# Patient Record
Sex: Female | Born: 1975 | Race: White | Hispanic: No | Marital: Single
Health system: Midwestern US, Community
[De-identification: ages and names within clinical notes are randomized; demographics above are authoritative.]

## PROBLEM LIST (undated history)

## (undated) DIAGNOSIS — F319 Bipolar disorder, unspecified: Secondary | ICD-10-CM

## (undated) DIAGNOSIS — M1990 Unspecified osteoarthritis, unspecified site: Secondary | ICD-10-CM

## (undated) DIAGNOSIS — F410 Panic disorder [episodic paroxysmal anxiety] without agoraphobia: Secondary | ICD-10-CM

## (undated) HISTORY — PX: OTHER SURGICAL HISTORY: SHX169

---

## 2013-11-08 ENCOUNTER — Encounter (HOSPITAL_COMMUNITY): Payer: Self-pay | Admitting: Emergency Medicine

## 2013-11-08 ENCOUNTER — Emergency Department (HOSPITAL_COMMUNITY)
Admission: EM | Admit: 2013-11-08 | Discharge: 2013-11-09 | Disposition: A | Discharge status: Home / Self Care | Payer: Medicaid Other | Attending: Emergency Medicine | Admitting: Emergency Medicine

## 2013-11-08 ENCOUNTER — Emergency Department (HOSPITAL_COMMUNITY): Payer: Medicaid Other

## 2013-11-08 DIAGNOSIS — Z3202 Encounter for pregnancy test, result negative: Secondary | ICD-10-CM | POA: Insufficient documentation

## 2013-11-08 DIAGNOSIS — N898 Other specified noninflammatory disorders of vagina: Secondary | ICD-10-CM | POA: Insufficient documentation

## 2013-11-08 DIAGNOSIS — Z8739 Personal history of other diseases of the musculoskeletal system and connective tissue: Secondary | ICD-10-CM | POA: Insufficient documentation

## 2013-11-08 DIAGNOSIS — B9789 Other viral agents as the cause of diseases classified elsewhere: Secondary | ICD-10-CM | POA: Insufficient documentation

## 2013-11-08 DIAGNOSIS — Z8659 Personal history of other mental and behavioral disorders: Secondary | ICD-10-CM | POA: Insufficient documentation

## 2013-11-08 DIAGNOSIS — Z975 Presence of (intrauterine) contraceptive device: Secondary | ICD-10-CM | POA: Insufficient documentation

## 2013-11-08 DIAGNOSIS — B349 Viral infection, unspecified: Secondary | ICD-10-CM

## 2013-11-08 DIAGNOSIS — N949 Unspecified condition associated with female genital organs and menstrual cycle: Secondary | ICD-10-CM | POA: Insufficient documentation

## 2013-11-08 DIAGNOSIS — R102 Pelvic and perineal pain: Secondary | ICD-10-CM

## 2013-11-08 HISTORY — DX: Bipolar disorder, unspecified: F31.9

## 2013-11-08 HISTORY — DX: Unspecified osteoarthritis, unspecified site: M19.90

## 2013-11-08 LAB — CBC WITH DIFFERENTIAL/PLATELET
BASOS ABS: 0.1 10*3/uL (ref 0.0–0.1)
Basophils Relative: 1 % (ref 0–1)
Eosinophils Absolute: 0.1 10*3/uL (ref 0.0–0.7)
Eosinophils Relative: 1 % (ref 0–5)
HEMATOCRIT: 39.6 % (ref 36.0–46.0)
Hemoglobin: 13.3 g/dL (ref 12.0–15.0)
LYMPHS ABS: 1.7 10*3/uL (ref 0.7–4.0)
LYMPHS PCT: 19 % (ref 12–46)
MCH: 29.8 pg (ref 26.0–34.0)
MCHC: 33.6 g/dL (ref 30.0–36.0)
MCV: 88.6 fL (ref 78.0–100.0)
MONO ABS: 0.6 10*3/uL (ref 0.1–1.0)
Monocytes Relative: 7 % (ref 3–12)
NEUTROS ABS: 6.2 10*3/uL (ref 1.7–7.7)
Neutrophils Relative %: 72 % (ref 43–77)
Platelets: 239 10*3/uL (ref 150–400)
RBC: 4.47 MIL/uL (ref 3.87–5.11)
RDW: 13.2 % (ref 11.5–15.5)
WBC: 8.7 10*3/uL (ref 4.0–10.5)

## 2013-11-08 LAB — I-STAT CHEM 8, ED
BUN: 13 mg/dL (ref 6–23)
CALCIUM ION: 1.18 mmol/L (ref 1.12–1.23)
CHLORIDE: 103 meq/L (ref 96–112)
Creatinine, Ser: 1 mg/dL (ref 0.50–1.10)
Glucose, Bld: 67 mg/dL — ABNORMAL LOW (ref 70–99)
HCT: 42 % (ref 36.0–46.0)
Hemoglobin: 14.3 g/dL (ref 12.0–15.0)
Potassium: 4.1 mEq/L (ref 3.7–5.3)
Sodium: 141 mEq/L (ref 137–147)
TCO2: 24 mmol/L (ref 0–100)

## 2013-11-08 LAB — URINALYSIS, ROUTINE W REFLEX MICROSCOPIC
Bilirubin Urine: NEGATIVE
GLUCOSE, UA: NEGATIVE mg/dL
Hgb urine dipstick: NEGATIVE
Ketones, ur: NEGATIVE mg/dL
LEUKOCYTES UA: NEGATIVE
NITRITE: NEGATIVE
PROTEIN: NEGATIVE mg/dL
Specific Gravity, Urine: 1.01 (ref 1.005–1.030)
UROBILINOGEN UA: 0.2 mg/dL (ref 0.0–1.0)
pH: 7 (ref 5.0–8.0)

## 2013-11-08 LAB — WET PREP, GENITAL
Trich, Wet Prep: NONE SEEN
Yeast Wet Prep HPF POC: NONE SEEN

## 2013-11-08 LAB — POC URINE PREG, ED: Preg Test, Ur: NEGATIVE

## 2013-11-08 MED ORDER — CEFTRIAXONE SODIUM 250 MG IJ SOLR
250.0000 mg | Freq: Once | INTRAMUSCULAR | Status: AC
  Administered 2013-11-08: 250 mg via INTRAMUSCULAR
  Filled 2013-11-08: qty 250

## 2013-11-08 MED ORDER — HYDROMORPHONE HCL PF 1 MG/ML IJ SOLN
1.0000 mg | Freq: Once | INTRAMUSCULAR | Status: AC
  Administered 2013-11-08: 1 mg via INTRAVENOUS
  Filled 2013-11-08: qty 1

## 2013-11-08 MED ORDER — KETOROLAC TROMETHAMINE 30 MG/ML IJ SOLN
30.0000 mg | Freq: Once | INTRAMUSCULAR | Status: AC
  Administered 2013-11-08: 30 mg via INTRAVENOUS
  Filled 2013-11-08: qty 1

## 2013-11-08 MED ORDER — GUAIFENESIN 100 MG/5ML PO LIQD: 100.0000 mg | ORAL | Status: DC | PRN

## 2013-11-08 MED ORDER — AZITHROMYCIN 250 MG PO TABS
1000.0000 mg | ORAL_TABLET | Freq: Once | ORAL | Status: AC
  Administered 2013-11-08: 1000 mg via ORAL
  Filled 2013-11-08: qty 4

## 2013-11-08 MED ORDER — HYDROCODONE-ACETAMINOPHEN 7.5-325 MG/15ML PO SOLN: 15.0000 mL | Freq: Four times a day (QID) | ORAL | Status: DC | PRN

## 2013-11-08 MED ORDER — HYDROCODONE-ACETAMINOPHEN 7.5-325 MG/15ML PO SOLN
10.0000 mL | Freq: Once | ORAL | Status: AC
Start: 2013-11-08 — End: 2013-11-08
  Administered 2013-11-08: 10 mL via ORAL
  Filled 2013-11-08: qty 15

## 2013-11-08 MED ORDER — SODIUM CHLORIDE 0.9 % IV BOLUS (SEPSIS)
1000.0000 mL | Freq: Once | INTRAVENOUS | Status: AC
  Administered 2013-11-08: 1000 mL via INTRAVENOUS

## 2013-11-08 NOTE — ED Notes (Signed)
MD at bedside. 

## 2013-11-08 NOTE — Discharge Instructions (Signed)
Viral Infections A viral infection can be caused by different types of viruses.Most viral infections are not serious and resolve on their own. However, some infections may cause severe symptoms and may lead to further complications. SYMPTOMS Viruses can frequently cause:  Minor sore throat.  Aches and pains.  Headaches.  Runny nose.  Different types of rashes.  Watery eyes.  Tiredness.  Cough.  Loss of appetite.  Gastrointestinal infections, resulting in nausea, vomiting, and diarrhea. These symptoms do not respond to antibiotics because the infection is not caused by bacteria. However, you might catch a bacterial infection following the viral infection. This is sometimes called a "superinfection." Symptoms of such a bacterial infection may include:  Worsening sore throat with pus and difficulty swallowing.  Swollen neck glands.  Chills and a high or persistent fever.  Severe headache.  Tenderness over the sinuses.  Persistent overall ill feeling (malaise), muscle aches, and tiredness (fatigue).  Persistent cough.  Yellow, green, or brown mucus production with coughing. HOME CARE INSTRUCTIONS   Only take over-the-counter or prescription medicines for pain, discomfort, diarrhea, or fever as directed by your caregiver.  Drink enough water and fluids to keep your urine clear or pale yellow. Sports drinks can provide valuable electrolytes, sugars, and hydration.  Get plenty of rest and maintain proper nutrition. Soups and broths with crackers or rice are fine. SEEK IMMEDIATE MEDICAL CARE IF:   You have severe headaches, shortness of breath, chest pain, neck pain, or an unusual rash.  You have uncontrolled vomiting, diarrhea, or you are unable to keep down fluids.  You or your child has an oral temperature above 102 F (38.9 C), not controlled by medicine.  Your baby is older than 3 months with a rectal temperature of 102 F (38.9 C) or higher.  Your baby is 833  months old or younger with a rectal temperature of 100.4 F (38 C) or higher. MAKE SURE YOU:   Understand these instructions.  Will watch your condition.  Will get help right away if you are not doing well or get worse. Document Released: 03/08/2005 Document Revised: 08/21/2011 Document Reviewed: 10/03/2010 [facility] Patient Information 2014 [place], [place].   Emergency Department Resource Guide 1) Find a Doctor and Pay Out of Pocket Although you won't have to find out who is covered by your insurance plan, it is a good idea to ask around and get recommendations. You will then need to call the office and see if the doctor you have chosen will accept you as a new patient and what types of options they offer for patients who are self-pay. Some doctors offer discounts or will set up payment plans for their patients who do not have insurance, but you will need to ask so you aren't surprised when you get to your appointment.  2) Contact Your Local Health Department Not all health departments have doctors that can see patients for sick visits, but many do, so it is worth a call to see if yours does. If you don't know where your local health department is, you can check in your phone book. The CDC also has a tool to help you locate your state's health department, and many state websites also have listings of all of their local health departments.  3) Find a Walk-in Clinic If your illness is not likely to be very severe or complicated, you may want to try a walk in clinic. These are popping up all over the country in pharmacies, drugstores, and shopping centers.  They're usually staffed by nurse practitioners or physician assistants that have been trained to treat common illnesses and complaints. They're usually fairly quick and inexpensive. However, if you have serious medical issues or chronic medical problems, these are probably not your best option.  No Primary Care Doctor: - Call Health Connect at   [phone] - they can help you locate a primary care doctor that  accepts your insurance, provides certain services, etc. - Physician Referral Service- [phone]  Chronic Pain Problems: Organization         Address  Phone   Notes  [facility]  [phone] Patients need to be referred by their primary care doctor.   Medication Assistance: Organization         Address  Phone   Notes  [facility] Medication [facility] [address] [place], [place] [address] [phone] --Must be a resident of [facility] -- Must have NO insurance coverage whatsoever (no Medicaid/ Medicare, etc.) -- The pt. MUST have a primary care doctor that directs their care regularly and follows them in the community   MedAssist  [phone]   [employer]  [phone]    Agencies that provide inexpensive medical care: Organization         Address  Phone   Notes  [facility]  [phone]   [facility]    [phone]   [facility] [address] [place], [place] [address] [phone]   [facility] [address] [place] [place] [phone]   Planned Parenthood    [phone]   [facility]    [phone]   Community Health and [facility]  [address], [place] Phone:  [phone], Fax:  [phone] Hours of Operation:  9 am - 6 pm, M-F.  Also accepts Medicaid/Medicare and self-pay.  [facility]  [address], [place] Phone: [phone], Fax: [phone]. Hours of Operation:  8:30 am - 5:30 pm, M-F.  Also accepts Medicaid and self-pay.  [facility] [place] [address] [place] Phone: [phone]   [facility] [address] Natasha Bence [place], [place] [phone], Ext. [phone] Mondays & Thursdays: 7-9 AM.  First 15 patients are seen on a first come, first serve basis.     Medicaid-accepting [facility] Providers:  Organization         Address  Phone   Notes  [facility] [address], [place] [phone] Also accepts self-pay patients.  [facility] [address] Laurell Josephs [place], [place]  [phone]   [facility] [address], [place] [phone]   [facility] [address] [place] [phone]   Renaye Rakers [address], [place]   [phone] Only accepts [place] patients after they have their name applied to their card.   Self-Pay (no insurance) in [facility]:  Organization         Address  Phone   Notes  Sickle Cell Patients, [facility] [address] [employer], [place] [phone]   [facility] [address] [place], [place] [phone]   [facility]  [address], [place] [phone]  [facility]/Dr. Osei-Bonsu  [address] [place] [address], [place] or [address], [place] [phone] Phone number for both [place] and [place] locations is the same.  [facility] and [facility] [address], [place] [phone]   [facility] [place] [address] [place] [address], [place] or [address] [phone]   [facility] [address], [place] [phone]) [phone], phone; [phone], fax Sees patients 1st and 3rd Saturday of every month.  Must not qualify for public or private insurance (i.e. Medicaid, Medicare, [place] Health Choice, Veterans' Benefits)  Household income should be no more than 200% of the poverty level The clinic cannot treat you if you are pregnant or think you are pregnant  Sexually transmitted diseases are not treated at the clinic.    Dental Care: Organization         Address  Phone  Notes  [facility]  Department of [facility] [address], [place] [phone] Accepts children up to age 821 who are enrolled in [place]icaid or [place] Health Choice; pregnant women with a Medicaid card; and children who have applied for Medicaid or [place] Health Choice, but were declined, whose parents can pay a reduced fee at time of service.  [facility] Department of [facility] [place]  [address], [place] [phone] Accepts children up to age 37 who are enrolled in [place] or [place] Health Choice; pregnant women with a Medicaid card; and children who have applied for Medicaid or [place] Health Choice, but were declined, whose parents can pay a reduced fee at time of service.  [facility]  [address], [place] [phone]) [phone] Patients are seen by appointment only. Walk-ins are not accepted. [facility] will see patients 718 years of age and older. Monday - Tuesday (8am-5pm) Most Wednesdays (8:30-5pm) $30 per visit, cash only  [facility]  [address], [facility] [phone]) [phone] Patients are seen by appointment only. Walk-ins are not accepted. [facility] will see patients 37 years of age and older. One Wednesday Evening (Monthly: Volunteer Based).  $30 per visit, cash only  [employer] [facility] [facility]  [phone] for adults; Children under age 134, call Graduate Pediatric Dentistry at [phone]. Children aged 734-14, please call [phone] to request a pediatric application.  Dental services are provided in all areas of dental care including fillings, crowns and bridges, complete and partial dentures, implants, gum treatment, root canals, and extractions. Preventive care is also provided. Treatment is provided to both adults and children. Patients are selected via a lottery and there is often a waiting list.   [facility] [address], [place]  [phone]  [email or web address]   [facility] [address], [place], [place] [phone], Ext. [phone] Second and Fourth Thursday of each month, opens at 6:30 AM; Clinic ends at 9 AM.  Patients are seen on a first-come first-served basis, and a limited number are seen during each clinic.   [facility]  [address], [place], [place] [phone]) [phone]   Eligibility Requirements You must have lived in [place], [place], or [place] counties for at least the last three months.   You cannot be eligible for state or federal sponsored [place] Cityhealthcare insurance, including CIGNAVeterans Administration, [place]icaid, or Har[employer] EntertainmentMedicare.   You generally cannot be eligible for healthcare insurance  through your employer.    How to apply: Eligibility screenings are held every Tuesday and Wednesday afternoon from 1:00 pm until 4:00 pm. You do not need an appointment for the interview!  [facility] [address], [place], [place] [phone]   [facility]  [phone]   [facility] [facility]  [phone]   [facility]  [phone]    Behavioral Health Resources in the Community: Intensive Outpatient Programs Organization         Address  Phone  Notes  [facility] [address], [place], [place] [phone]   [facility] [address], [place], [place] [phone]   [facility]: [facility] [address], [place], [place]  [phone]   [facility] [address],  [place], [place] [phone] or [phone]   Substance Abuse Resources Organization         Address  Phone  Notes  [facility]  [phone]   [facility]  [phone]   [facility]  [phone]   [facility]  [phone]   Residential & Outpatient Substance Abuse Program  [phone]   Psychological Services Organization          Address  Phone  Notes  [facility]  [phone]- [phone]   [facility]  [phone]- [phone]   [facility] [address], [place] [phone] or [phone]    Mobile Crisis Teams Organization         Address  Phone  Notes  [facility], Mobile Crisis Care Unit  [phone]   [facility]  [address] [place], [place] [phone]   Doristine LocksSharon DeEsch [address] [place] [phone]    Self-Help/Support Groups Organization         Address  Phone             Notes  Mental Health Assoc. of  - variety of support groups  [phone]- [phone] Call for more information  Narcotics Anonymous (NA), [facility] [address], [place]  2 meetings at this location   Statisticianesidential Treatment Programs Organization         Address  Phone  Notes  [facility] [address],    [place]  [phone]   [facility]  [address], [place] [phone], [place], [place] [phone]   [facility] [address], [place] [phone] Admissions: 8am-3pm M-F  [facility] [address]    [place], [place] [phone]   [facility] [address], [place], [place] [phone]   [facility] [address]  [place], [place] [phone]   [facility] - Intensive Outpatient [address]ure[address] Josephs[address], [place], [place] [phone]   [facility] ([facility] [address]  [place], [place] [phone] or [phone]   [facility] ([facility]) [address] [place], [place] [phone] Accepts Medicaid  [facility] [address]  [place] [phone] Substance Abuse/Addiction Treatment   [facility] Behavioral Health Resources Organization         Address  Phone  Notes  [facility]  [phone]   Angie FavaJulie Brannon, PhD [address] [place], [place]   [phone] or [phone]   [facility]   [address] [place], [place] [phone]  [facility] [address], [place], [place] [phone] Insurance/Medicaid/sponsorship through [facility]  °[facility] [address]                                    [place], [place] [phone] Therapy/tele-psych/case  °[facility] [address]  ° [place], [place] [phone]    °Dr. Arfeen  [phone]   °[facility] [place]  [employer] [place] [facility] 1) [address], [place] °2) [address], [place] °3)  [address], [place] [phone]) [phone] °([phone]) [phone] ° °([phone]) [phone]   °[place] Child Abuse Hotline (336) 342-1394 or (336) [phone] (After Hours)    ° ° ° °

## 2013-11-08 NOTE — ED Notes (Signed)
Pt states she just finished amoxicillin for URI. Pt states she is still having a productive cough, chills, muscle aches and lower abdominal pain. Pt also states her gums are sore. States "I'm shutting down." States today she started having lower abdominal pain. Denies dysuria. Pt ambulatory to exam room with steady gait. Pt alert, no acute distress.

## 2013-11-08 NOTE — ED Provider Notes (Signed)
CSN: [id number]     Arrival date & time 11/08/13  1921 History   First MD Initiated Contact with Patient 11/08/13 1939     Chief Complaint  Patient presents with  . Cough  . Abdominal Pain     (Consider location/radiation/quality/duration/timing/severity/associated sxs/prior Treatment) HPI  38 year old female with history of bipolar presents with URI symptoms, and abdominal pain. Patient states for more than a week she has been having nasal congestions, chest congestions, productive cough, that was initially improved with taking amoxicillin which she finished 3 days ago. However for the past few day she is now having worsening cough, having chills, myalgias, and this afternoon she had severe low abnormal pain and pain with urinating prior to arrival. She reports she had severe UTI in the past requiring hospital admission and therefore decided to come to ER for further management. She denies fever, headache, neck stiffness, SOB, hemoptysis, hematuria, vaginal discharge, or rash. She is an IUD but reports having vaginal bleeding about a week ago, one isolated incident. She denies any pain with sexual activity.  Past Medical History  Diagnosis Date  . Bipolar 1 disorder   . Arthritis    No past surgical history on file. No family history on file. History  Substance Use Topics  . Smoking status: Not on file  . Smokeless tobacco: Not on file  . Alcohol Use: Not on file   OB History   Grav Para Term Preterm Abortions TAB SAB Ect Mult Living                 Review of Systems  All other systems reviewed and are negative.     Allergies  Review of patient's allergies indicates no known allergies.  Home Medications   Prior to Admission medications   Not on File   BP 112/66  Pulse 120  Temp(Src) 99 F (37.2 C) (Oral)  Resp 20  SpO2 99% Physical Exam  Nursing note and vitals reviewed. Constitutional: She appears well-developed and well-nourished. No distress.  HENT:  Head:  Normocephalic and atraumatic.  Right Ear: External ear normal.  Left Ear: External ear normal.  Mouth/Throat: Oropharynx is clear and moist.  Eyes: Conjunctivae are normal.  Neck: Normal range of motion. Neck supple. No JVD present.  Cardiovascular: Normal rate and regular rhythm.   Pulmonary/Chest: Effort normal and breath sounds normal. She exhibits no tenderness.  Persistent nonproductive cough  Abdominal: Soft. There is no tenderness (suprapubic and lower abd tenderness on palpation without guarding or rebound tenderness.  No Murphy sign, no pain at McBurney's point).  Genitourinary: Vagina normal and uterus normal. There is no rash or lesion on the right labia. There is no rash or lesion on the left labia. Cervix exhibits motion tenderness and friability. Cervix exhibits no discharge. Right adnexum displays no mass and no tenderness. Left adnexum displays no mass and no tenderness. No erythema, tenderness or bleeding around the vagina. No vaginal discharge found.    Chaperone present:  Lymphadenopathy:    She has no cervical adenopathy.       Right: No inguinal adenopathy present.       Left: No inguinal adenopathy present.    ED Course  Procedures (including critical care time)  7:58 PM Pt here with URI sxs.  She report sxs persist despite recent abx.  CXR ordered.  Pt also having dysuria and lower abd pain.  Will perform pelvic exam.  Pt is tachycardic, IVF given.    9:19 PM Pt did  have tenderness to cervix on pelvic examination.  Her wet prep shows many WBC.  Pregnancy test negative, UA unremarkable, and CXR normal.  I discussed possibility of PID. Pt sts she has not had sexual activities in 2 years.  I will give rocephin/zithromax here.  Her sxs likely viral etiology, will treat symptomatically.    10:21 PM Pt continues to endorse suprapubic abd pain despite receiving toradol and hycet.  Given the sudden onset of her lower abd pain, i will obtain basic labs and pelvic US to r/o  ovarian torsion.  I have low suspicion for appy given no n/v/d or loss of appetite.  Pain medication given.  Care discussed with Dr. Criss Alvine.   11:15 PM [place] tech notify that pt appears to be too  Uncomfortable to have US performed.  Her labs are reassuring otherwise.  Will give additional pain medication to help facilitate her study.    12:14 AM Pelvic US showing no evidence of ovarian torsion.  Probable physiologic cyst left ovary measuring up to 1.7cm with a small volume pelvic free fluid.  Finding is unconcerning.  Pt is stable for discharge.  She will f/u with her PCP.  Return precaution discussed.     Labs Review Labs Reviewed  WET PREP, GENITAL - Abnormal; Notable for the following:    Clue Cells Wet Prep HPF POC RARE (*)    WBC, Wet Prep HPF POC MANY (*)    All other components within normal limits  I-STAT CHEM 8, ED - Abnormal; Notable for the following:    Glucose, Bld 67 (*)    All other components within normal limits  GC/CHLAMYDIA PROBE AMP  URINALYSIS, ROUTINE W REFLEX MICROSCOPIC  CBC WITH DIFFERENTIAL  POC URINE PREG, ED    Imaging Review Dg Chest 2 View  11/08/2013   CLINICAL DATA:  Cough  EXAM: CHEST  2 VIEW  COMPARISON:  None.  FINDINGS: The heart size and mediastinal contours are within normal limits. Both lungs are clear. The visualized skeletal structures are unremarkable.  IMPRESSION: No active cardiopulmonary disease.   Electronically Signed   By: Elige Ko   On: 11/08/2013 20:14   US Transvaginal Non-ob  11/09/2013   CLINICAL DATA:  38 year old female with pain. Initial encounter.  EXAM: TRANSABDOMINAL AND TRANSVAGINAL ULTRASOUND OF PELVIS  DOPPLER ULTRASOUND OF OVARIES  TECHNIQUE: Both transabdominal and transvaginal ultrasound examinations of the pelvis were performed. Transabdominal technique was performed for global imaging of the pelvis including uterus, ovaries, adnexal regions, and pelvic cul-de-sac.  It was necessary to proceed with endovaginal exam  following the transabdominal exam to visualize the ovaries. Color and duplex Doppler ultrasound was utilized to evaluate blood flow to the ovaries.  COMPARISON:  None.  FINDINGS: Uterus  Measurements: 8.4 x 4.4 x 4.8 cm. No fibroids or other mass visualized.  Endometrium  Thickness: 2 mm.  Echogenic, shadowing IUD identified.  Right ovary  Measurements: 2.9 x 1.7 x 2.7 cm with normal small follicles. Normal appearance/no adnexal mass.  Left ovary  Measurements: 3.6 x 3.1 x 2.9 cm. Hypoechoic or mildly complex cyst measuring up to 1.7 cm (image 42).  Pulsed Doppler evaluation of both ovaries demonstrates normal low-resistance arterial and venous waveforms.  Other findings  Small volume simple appearing pelvic free fluid.  IMPRESSION: 1. No evidence of ovarian torsion. 2. Probable physiologic cyst left ovary measuring up to 1.7 cm. Small volume pelvic free fluid also likely physiologic.   Electronically Signed   By: Augusto Gamble  M.D.   On: 11/09/2013 00:06   US Pelvis Complete  11/09/2013   CLINICAL DATA:  38 year old female with pain. Initial encounter.  EXAM: TRANSABDOMINAL AND TRANSVAGINAL ULTRASOUND OF PELVIS  DOPPLER ULTRASOUND OF OVARIES  TECHNIQUE: Both transabdominal and transvaginal ultrasound examinations of the pelvis were performed. Transabdominal technique was performed for global imaging of the pelvis including uterus, ovaries, adnexal regions, and pelvic cul-de-sac.  It was necessary to proceed with endovaginal exam following the transabdominal exam to visualize the ovaries. Color and duplex Doppler ultrasound was utilized to evaluate blood flow to the ovaries.  COMPARISON:  None.  FINDINGS: Uterus  Measurements: 8.4 x 4.4 x 4.8 cm. No fibroids or other mass visualized.  Endometrium  Thickness: 2 mm.  Echogenic, shadowing IUD identified.  Right ovary  Measurements: 2.9 x 1.7 x 2.7 cm with normal small follicles. Normal appearance/no adnexal mass.  Left ovary  Measurements: 3.6 x 3.1 x 2.9 cm. Hypoechoic  or mildly complex cyst measuring up to 1.7 cm (image 42).  Pulsed Doppler evaluation of both ovaries demonstrates normal low-resistance arterial and venous waveforms.  Other findings  Small volume simple appearing pelvic free fluid.  IMPRESSION: 1. No evidence of ovarian torsion. 2. Probable physiologic cyst left ovary measuring up to 1.7 cm. Small volume pelvic free fluid also likely physiologic.   Electronically Signed   By: Augusto Gamble M.D.   On: 11/09/2013 00:06   [place] Art/ven Flow Abd Pelv Doppler  11/09/2013   CLINICAL DATA:  38 year old female with pain. Initial encounter.  EXAM: TRANSABDOMINAL AND TRANSVAGINAL ULTRASOUND OF PELVIS  DOPPLER ULTRASOUND OF OVARIES  TECHNIQUE: Both transabdominal and transvaginal ultrasound examinations of the pelvis were performed. Transabdominal technique was performed for global imaging of the pelvis including uterus, ovaries, adnexal regions, and pelvic cul-de-sac.  It was necessary to proceed with endovaginal exam following the transabdominal exam to visualize the ovaries. Color and duplex Doppler ultrasound was utilized to evaluate blood flow to the ovaries.  COMPARISON:  None.  FINDINGS: Uterus  Measurements: 8.4 x 4.4 x 4.8 cm. No fibroids or other mass visualized.  Endometrium  Thickness: 2 mm.  Echogenic, shadowing IUD identified.  Right ovary  Measurements: 2.9 x 1.7 x 2.7 cm with normal small follicles. Normal appearance/no adnexal mass.  Left ovary  Measurements: 3.6 x 3.1 x 2.9 cm. Hypoechoic or mildly complex cyst measuring up to 1.7 cm (image 42).  Pulsed Doppler evaluation of both ovaries demonstrates normal low-resistance arterial and venous waveforms.  Other findings  Small volume simple appearing pelvic free fluid.  IMPRESSION: 1. No evidence of ovarian torsion. 2. Probable physiologic cyst left ovary measuring up to 1.7 cm. Small volume pelvic free fluid also likely physiologic.   Electronically Signed   By: Augusto Gamble M.D.   On: 11/09/2013 00:06     EKG  Interpretation None      MDM   Final diagnoses:  Viral syndrome    BP 105/59  Pulse 98  Temp(Src) 98.9 F (37.2 C) (Oral)  Resp 20  SpO2 97%  I have reviewed nursing notes and vital signs. I personally reviewed the imaging tests through PACS system  I reviewed available ER/hospitalization records thought the EMR     Fayrene Helper, [place] 11/09/13 0017

## 2013-11-09 NOTE — ED Provider Notes (Signed)
Medical screening examination/treatment/procedure(s) were performed by non-physician practitioner and as supervising physician I was immediately available for consultation/collaboration.   EKG Interpretation None        Amika Tassin T Deronda Christian, MD 11/09/13 1459 

## 2013-11-10 ENCOUNTER — Encounter (HOSPITAL_COMMUNITY): Payer: Self-pay | Admitting: Emergency Medicine

## 2013-11-10 ENCOUNTER — Emergency Department (HOSPITAL_COMMUNITY)
Admission: EM | Admit: 2013-11-10 | Discharge: 2013-11-10 | Disposition: A | Discharge status: Home / Self Care | Payer: Medicaid Other | Attending: Emergency Medicine | Admitting: Emergency Medicine

## 2013-11-10 DIAGNOSIS — F319 Bipolar disorder, unspecified: Secondary | ICD-10-CM | POA: Insufficient documentation

## 2013-11-10 DIAGNOSIS — R05 Cough: Secondary | ICD-10-CM | POA: Insufficient documentation

## 2013-11-10 DIAGNOSIS — Z8739 Personal history of other diseases of the musculoskeletal system and connective tissue: Secondary | ICD-10-CM | POA: Insufficient documentation

## 2013-11-10 DIAGNOSIS — J3489 Other specified disorders of nose and nasal sinuses: Secondary | ICD-10-CM | POA: Insufficient documentation

## 2013-11-10 DIAGNOSIS — Z79899 Other long term (current) drug therapy: Secondary | ICD-10-CM | POA: Insufficient documentation

## 2013-11-10 DIAGNOSIS — N83209 Unspecified ovarian cyst, unspecified side: Secondary | ICD-10-CM | POA: Insufficient documentation

## 2013-11-10 DIAGNOSIS — R059 Cough, unspecified: Secondary | ICD-10-CM | POA: Insufficient documentation

## 2013-11-10 LAB — CBC WITH DIFFERENTIAL/PLATELET
Basophils Absolute: 0 10*3/uL (ref 0.0–0.1)
Basophils Relative: 0 % (ref 0–1)
EOS PCT: 2 % (ref 0–5)
Eosinophils Absolute: 0.2 10*3/uL (ref 0.0–0.7)
HCT: 34.8 % — ABNORMAL LOW (ref 36.0–46.0)
Hemoglobin: 11.9 g/dL — ABNORMAL LOW (ref 12.0–15.0)
LYMPHS ABS: 1.5 10*3/uL (ref 0.7–4.0)
LYMPHS PCT: 21 % (ref 12–46)
MCH: 30.4 pg (ref 26.0–34.0)
MCHC: 34.2 g/dL (ref 30.0–36.0)
MCV: 88.8 fL (ref 78.0–100.0)
Monocytes Absolute: 0.5 10*3/uL (ref 0.1–1.0)
Monocytes Relative: 7 % (ref 3–12)
Neutro Abs: 5 10*3/uL (ref 1.7–7.7)
Neutrophils Relative %: 70 % (ref 43–77)
Platelets: 200 10*3/uL (ref 150–400)
RBC: 3.92 MIL/uL (ref 3.87–5.11)
RDW: 13.3 % (ref 11.5–15.5)
WBC: 7.2 10*3/uL (ref 4.0–10.5)

## 2013-11-10 LAB — GC/CHLAMYDIA PROBE AMP
CT Probe RNA: NEGATIVE
GC PROBE AMP APTIMA: NEGATIVE

## 2013-11-10 MED ORDER — TRAMADOL HCL 50 MG PO TABS: 50.0000 mg | ORAL_TABLET | Freq: Four times a day (QID) | ORAL | Status: DC | PRN

## 2013-11-10 NOTE — Discharge Instructions (Signed)

## 2013-11-10 NOTE — ED Provider Notes (Signed)
CSN: [id number]     Arrival date & time 11/10/13  1012 History   First MD Initiated Contact with Patient 11/10/13 1037     Chief Complaint  Patient presents with  . Abdominal Cramping      HPI Pt. Having intermittent abdominal/pelvic cramping that began a few days ago. Pt. Is concerned after her pelvic exam on 5/30 they were unable to see her Mesh and she believes that could be causing the discomfort. She is also having coughing and nasal congestion. She denies any vaginal bleeding.   Past Medical History  Diagnosis Date  . Bipolar 1 disorder   . Arthritis    Past Surgical History  Procedure Laterality Date  . Vaginal lift     . Bladder tuck     No family history on file. History  Substance Use Topics  . Smoking status: Never Smoker   . Smokeless tobacco: Not on file  . Alcohol Use: No   OB History   Grav Para Term Preterm Abortions TAB SAB Ect Mult Living                 Review of Systems  All other systems reviewed and are negative  Allergies  Review of patient's allergies indicates no known allergies.  Home Medications   Prior to Admission medications   Medication Sig Start Date End Date Taking? Authorizing Provider  lithium 300 MG tablet Take 300 mg by mouth 3 (three) times daily.   Yes Historical Provider, MD  QUEtiapine (SEROQUEL) 50 MG tablet Take 50 mg by mouth 3 (three) times daily.   Yes Historical Provider, MD  traMADol (ULTRAM) 50 MG tablet Take 1 tablet (50 mg total) by mouth every 6 (six) hours as needed. 11/10/13   Nelia Shiobert L Dorian Renfro, MD   BP 116/72  Pulse 65  Temp(Src) 98.3 F (36.8 C) (Oral)  SpO2 100% Physical Exam  Nursing note and vitals reviewed. Constitutional: She is oriented to person, place, and time. She appears well-developed and well-nourished. No distress.  HENT:  Head: Normocephalic and atraumatic.  Eyes: Pupils are equal, round, and reactive to light.  Neck: Normal range of motion.  Cardiovascular: Normal rate and intact distal  pulses.   Pulmonary/Chest: No respiratory distress.  Abdominal: Normal appearance. She exhibits no distension. There is no tenderness. There is no rebound and no guarding.  Musculoskeletal: Normal range of motion.  Neurological: She is alert and oriented to person, place, and time. No cranial nerve deficit.  Skin: Skin is warm and dry. No rash noted.  Psychiatric: She has a normal mood and affect. Her behavior is normal.    ED Course  Procedures (including critical care time) Labs Review Labs Reviewed  CBC WITH DIFFERENTIAL - Abnormal; Notable for the following:    Hemoglobin 11.9 (*)    HCT 34.8 (*)    All other components within normal limits    Imaging Review Dg Chest 2 View  11/08/2013   CLINICAL DATA:  Cough  EXAM: CHEST  2 VIEW  COMPARISON:  None.  FINDINGS: The heart size and mediastinal contours are within normal limits. Both lungs are clear. The visualized skeletal structures are unremarkable.  IMPRESSION: No active cardiopulmonary disease.   Electronically Signed   By: Elige KoHetal  Patel   On: 11/08/2013 20:14   [place] Transvaginal Non-ob  11/09/2013   CLINICAL DATA:  38 year old female with pain. Initial encounter.  EXAM: TRANSABDOMINAL AND TRANSVAGINAL ULTRASOUND OF PELVIS  DOPPLER ULTRASOUND OF OVARIES  TECHNIQUE: Both transabdominal  and transvaginal ultrasound examinations of the pelvis were performed. Transabdominal technique was performed for global imaging of the pelvis including uterus, ovaries, adnexal regions, and pelvic cul-de-sac.  It was necessary to proceed with endovaginal exam following the transabdominal exam to visualize the ovaries. Color and duplex Doppler ultrasound was utilized to evaluate blood flow to the ovaries.  COMPARISON:  None.  FINDINGS: Uterus  Measurements: 8.4 x 4.4 x 4.8 cm. No fibroids or other mass visualized.  Endometrium  Thickness: 2 mm.  Echogenic, shadowing IUD identified.  Right ovary  Measurements: 2.9 x 1.7 x 2.7 cm with normal small follicles.  Normal appearance/no adnexal mass.  Left ovary  Measurements: 3.6 x 3.1 x 2.9 cm. Hypoechoic or mildly complex cyst measuring up to 1.7 cm (image 42).  Pulsed Doppler evaluation of both ovaries demonstrates normal low-resistance arterial and venous waveforms.  Other findings  Small volume simple appearing pelvic free fluid.  IMPRESSION: 1. No evidence of ovarian torsion. 2. Probable physiologic cyst left ovary measuring up to 1.7 cm. Small volume pelvic free fluid also likely physiologic.   Electronically Signed   By: Augusto Gamble M.D.   On: 11/09/2013 00:06   US Pelvis Complete  11/09/2013   CLINICAL DATA:  38 year old female with pain. Initial encounter.  EXAM: TRANSABDOMINAL AND TRANSVAGINAL ULTRASOUND OF PELVIS  DOPPLER ULTRASOUND OF OVARIES  TECHNIQUE: Both transabdominal and transvaginal ultrasound examinations of the pelvis were performed. Transabdominal technique was performed for global imaging of the pelvis including uterus, ovaries, adnexal regions, and pelvic cul-de-sac.  It was necessary to proceed with endovaginal exam following the transabdominal exam to visualize the ovaries. Color and duplex Doppler ultrasound was utilized to evaluate blood flow to the ovaries.  COMPARISON:  None.  FINDINGS: Uterus  Measurements: 8.4 x 4.4 x 4.8 cm. No fibroids or other mass visualized.  Endometrium  Thickness: 2 mm.  Echogenic, shadowing IUD identified.  Right ovary  Measurements: 2.9 x 1.7 x 2.7 cm with normal small follicles. Normal appearance/no adnexal mass.  Left ovary  Measurements: 3.6 x 3.1 x 2.9 cm. Hypoechoic or mildly complex cyst measuring up to 1.7 cm (image 42).  Pulsed Doppler evaluation of both ovaries demonstrates normal low-resistance arterial and venous waveforms.  Other findings  Small volume simple appearing pelvic free fluid.  IMPRESSION: 1. No evidence of ovarian torsion. 2. Probable physiologic cyst left ovary measuring up to 1.7 cm. Small volume pelvic free fluid also likely physiologic.    Electronically Signed   By: Augusto Gamble M.D.   On: 11/09/2013 00:06   [place] Art/ven Flow Abd Pelv Doppler  11/09/2013   CLINICAL DATA:  38 year old female with pain. Initial encounter.  EXAM: TRANSABDOMINAL AND TRANSVAGINAL ULTRASOUND OF PELVIS  DOPPLER ULTRASOUND OF OVARIES  TECHNIQUE: Both transabdominal and transvaginal ultrasound examinations of the pelvis were performed. Transabdominal technique was performed for global imaging of the pelvis including uterus, ovaries, adnexal regions, and pelvic cul-de-sac.  It was necessary to proceed with endovaginal exam following the transabdominal exam to visualize the ovaries. Color and duplex Doppler ultrasound was utilized to evaluate blood flow to the ovaries.  COMPARISON:  None.  FINDINGS: Uterus  Measurements: 8.4 x 4.4 x 4.8 cm. No fibroids or other mass visualized.  Endometrium  Thickness: 2 mm.  Echogenic, shadowing IUD identified.  Right ovary  Measurements: 2.9 x 1.7 x 2.7 cm with normal small follicles. Normal appearance/no adnexal mass.  Left ovary  Measurements: 3.6 x 3.1 x 2.9 cm. Hypoechoic or mildly complex cyst  measuring up to 1.7 cm (image 42).  Pulsed Doppler evaluation of both ovaries demonstrates normal low-resistance arterial and venous waveforms.  Other findings  Small volume simple appearing pelvic free fluid.  IMPRESSION: 1. No evidence of ovarian torsion. 2. Probable physiologic cyst left ovary measuring up to 1.7 cm. Small volume pelvic free fluid also likely physiologic.   Electronically Signed   By: Augusto Gamble M.D.   On: 11/09/2013 00:06      MDM   Final diagnoses:  Ovarian cyst        Nelia Shi, MD 11/10/13 1243

## 2013-11-10 NOTE — ED Notes (Signed)
Pt. Having intermittent abdominal/pelvic cramping that began a few days ago.  Pt. Is concerned after her pelvic exam on 5/30 they were unable to see her Mesh and she believes that could be causing the discomfort.  She is also having coughing and nasal congestion.  She denies any vaginal bleeding.  Pt. Is alert and oriented X3, Skin is p/w/d/

## 2013-11-10 NOTE — ED Notes (Signed)
PT comfortable with discharge and follow up instructions. Prescriptions x1. 

## 2013-12-12 ENCOUNTER — Encounter (HOSPITAL_COMMUNITY): Payer: Self-pay | Admitting: Emergency Medicine

## 2013-12-12 ENCOUNTER — Emergency Department (HOSPITAL_COMMUNITY)
Admission: EM | Admit: 2013-12-12 | Discharge: 2013-12-12 | Disposition: A | Discharge status: Home / Self Care | Payer: Medicaid Other | Source: Home / Self Care

## 2013-12-12 DIAGNOSIS — M549 Dorsalgia, unspecified: Secondary | ICD-10-CM

## 2013-12-12 DIAGNOSIS — X58XXXA Exposure to other specified factors, initial encounter: Secondary | ICD-10-CM

## 2013-12-12 DIAGNOSIS — T148XXA Other injury of unspecified body region, initial encounter: Secondary | ICD-10-CM

## 2013-12-12 DIAGNOSIS — G8929 Other chronic pain: Secondary | ICD-10-CM

## 2013-12-12 MED ORDER — TRAMADOL HCL 50 MG PO TABS: 50.0000 mg | ORAL_TABLET | Freq: Four times a day (QID) | ORAL | Status: DC | PRN

## 2013-12-12 NOTE — ED Provider Notes (Signed)
CSN: [id number]     Arrival date & time 12/12/13  1535 History   None    Chief Complaint  Patient presents with  . Back Pain   (Consider location/radiation/quality/duration/timing/severity/associated sxs/prior Treatment) HPI Comments: 38 year old loquacious female has recently [place]. She states she has chronic back pain due to various diagnoses. Some of these have included arthritis, sciatica and muscle pain. She has been given tramadol in the past. She also takes ibuprofen. 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 She is not having much pain now. It usually occurs in the morning upon arising.   Past Medical History  Diagnosis Date  . Bipolar 1 disorder   . Arthritis    Past Surgical History  Procedure Laterality Date  . Vaginal lift     . Bladder tuck     History reviewed. No pertinent family history. History  Substance Use Topics  . Smoking status: Never Smoker   . Smokeless tobacco: Not on file  . Alcohol Use: No   OB History   Grav Para Term Preterm Abortions TAB SAB Ect Mult Living                 Review of Systems  Constitutional: Negative for fever, chills and activity change.  HENT: Negative.   Respiratory: Negative.   Cardiovascular: Negative.   Musculoskeletal: Positive for back pain.       As per HPI  Skin: Negative for color change, pallor and rash.  Neurological: Negative.     Allergies  Review of patient's allergies indicates no known allergies.  Home Medications   Prior to Admission medications   Medication Sig Start Date End Date Taking? Authorizing Provider  lithium 300 MG tablet Take 300 mg by mouth 3 (three) times daily.   Yes Historical Provider, MD  QUEtiapine (SEROQUEL) 50 MG tablet Take 50 mg by mouth 3 (three) times daily.   Yes Historical Provider, MD  traMADol (ULTRAM) 50 MG tablet Take 1 tablet (50 mg total) by mouth every 6 (six) hours as needed. 11/10/13   Nelia Shiobert L Beaton, MD  traMADol  (ULTRAM) 50 MG tablet Take 1 tablet (50 mg total) by mouth every 6 (six) hours as needed. 12/12/13   Hayden Rasmussenavid Daryle Boyington, NP   BP 114/61  Pulse 72  Temp(Src) 97.5 F (36.4 C) (Oral)  Resp 14  SpO2 100% Physical Exam  Nursing note and vitals reviewed. Constitutional: She is oriented to person, place, and time. She appears well-developed and well-nourished. No distress.  HENT:  Head: Normocephalic and atraumatic.  Neck: Normal range of motion. Neck supple.  Cardiovascular: Normal rate, regular rhythm and normal heart sounds.   Pulmonary/Chest: Effort normal and breath sounds normal. No respiratory distress. She has no wheezes. She has no rales.  Musculoskeletal:  Patient reports to the bilateral para lumbar musculature as the source of her pain. Having her lean forward produces pain to these muscles. She can flex to 90. No spinal tenderness or deformity.  Neurological: She is alert and oriented to person, place, and time. No cranial nerve deficit.  Skin: Skin is warm and dry.    ED Course  Procedures (including critical care time) Labs Review Labs Reviewed - No data to display  Imaging Review No results found.   MDM   1. Chronic back pain greater than 3 months duration   2. Muscle strain     From today's exam the patient's pain is originating from the paralumbar  musculature. There is local tenderness and pain with certain movement. No signs of radiculopathy, focal weakness or sciatica. She would most likely benefit from physical therapy and good body mechanics. While palpating the paralumbar musculature she states that felt good and wished that she could have that type of palpation all the time. No problems with ambulation, getting on to her on the exam table. Continue taking the ibuprofen Tramadol 50 mg Q6 hours when necessary pain #20    Hayden Rasmussenavid Delbra Zellars, NP 12/12/13 1702

## 2013-12-12 NOTE — Discharge Instructions (Signed)
Back Pain, Adult °Low back pain is very common. About 1 in 5 people have back pain. The cause of low back pain is rarely dangerous. The pain often gets better over time. About half of people with a sudden onset of back pain feel better in just 2 weeks. About 8 in 10 people feel better by 6 weeks.  °CAUSES °Some common causes of back pain include: °· Strain of the muscles or ligaments supporting the spine. °· Wear and tear (degeneration) of the spinal discs. °· Arthritis. °· Direct injury to the back. °DIAGNOSIS °Most of the time, the direct cause of low back pain is not known. However, back pain can be treated effectively even when the exact cause of the pain is unknown. Answering your caregiver's questions about your overall health and symptoms is one of the most accurate ways to make sure the cause of your pain is not dangerous. If your caregiver needs more information, he or she may order lab work or imaging tests (X-rays or MRIs). However, even if imaging tests show changes in your back, this usually does not require surgery. °HOME CARE INSTRUCTIONS °For many people, back pain returns. Since low back pain is rarely dangerous, it is often a condition that people can learn to manage on their own.  °· Remain active. It is stressful on the back to sit or stand in one place. Do not sit, drive, or stand in one place for more than 30 minutes at a time. Take short walks on level surfaces as soon as pain allows. Try to increase the length of time you walk each day. °· Do not stay in bed. Resting more than 1 or 2 days can delay your recovery. °· Do not avoid exercise or work. Your body is made to move. It is not dangerous to be active, even though your back may hurt. Your back will likely heal faster if you return to being active before your pain is gone. °· Pay attention to your body when you  bend and lift. Many people have less discomfort when lifting if they bend their knees, keep the load close to their bodies, and  avoid twisting. Often, the most comfortable positions are those that put less stress on your recovering back. °· Find a comfortable position to sleep. Use a firm mattress and lie on your side with your knees slightly bent. If you lie on your back, put a pillow under your knees. °· Only take over-the-counter or prescription medicines as directed by your caregiver. Over-the-counter medicines to reduce pain and inflammation are often the most helpful. Your caregiver may prescribe muscle relaxant drugs. These medicines help dull your pain so you can more quickly return to your normal activities and healthy exercise. °· Put ice on the injured area. °¨ Put ice in a plastic bag. °¨ Place a towel between your skin and the bag. °¨ Leave the ice on for 15-20 minutes, 03-04 times a day for the first 2 to 3 days. After that, ice and heat may be alternated to reduce pain and spasms. °· Ask your caregiver about trying back exercises and gentle massage. This may be of some benefit. °· Avoid feeling anxious or stressed. Stress increases muscle tension and can worsen back pain. It is important to recognize when you are anxious or stressed and learn ways to manage it. Exercise is a great option. °SEEK MEDICAL CARE IF: °· You have pain that is not relieved with rest or medicine. °· You have pain that does not improve in 1 week. °· You have new symptoms. °· You are generally not feeling well. °SEEK   IMMEDIATE MEDICAL CARE IF:   You have pain that radiates from your back into your legs.  You develop new bowel or bladder control problems.  You have unusual weakness or numbness in your arms or legs.  You develop nausea or vomiting.  You develop abdominal pain.  You feel faint. Document Released: 05/29/2005 Document Revised: 11/28/2011 Document Reviewed: 10/17/2010 [facility] Patient Information 2015 [place], [place]. This information is not intended to replace advice given to you by your health care provider. Make sure you  discuss any questions you have with your health care provider.  Chronic Back Pain  When back pain lasts longer than 3 months, it is called chronic back pain.People with chronic back pain often go through certain periods that are more intense (flare-ups).  CAUSES Chronic back pain can be caused by wear and tear (degeneration) on different structures in your back. These structures include:  The bones of your spine (vertebrae) and the joints surrounding your spinal cord and nerve roots (facets).  The strong, fibrous tissues that connect your vertebrae (ligaments). Degeneration of these structures may result in pressure on your nerves. This can lead to constant pain. HOME CARE INSTRUCTIONS  Avoid bending, heavy lifting, prolonged sitting, and activities which make the problem worse.  Take brief periods of rest throughout the day to reduce your pain. Lying down or standing usually is better than sitting while you are resting.  Take over-the-counter or prescription medicines only as directed by your caregiver. SEEK IMMEDIATE MEDICAL CARE IF:   You have weakness or numbness in one of your legs or feet.  You have trouble controlling your bladder or bowels.  You have nausea, vomiting, abdominal pain, shortness of breath, or fainting. Document Released: 07/06/2004 Document Revised: 08/21/2011 Document Reviewed: 05/13/2011 [facility]Exit[facility] Patient Information 2015 [place], [place]. This information is not intended to replace advice given to you by your health care provider. Make sure you discuss any questions you have with your health care provider.  Lumbosacral Strain Lumbosacral strain is a strain of any of the parts that make up your lumbosacral vertebrae. Your lumbosacral vertebrae are the bones that make up the lower third of your backbone. Your lumbosacral vertebrae are held together by muscles and tough, fibrous tissue (ligaments).  CAUSES  A sudden blow to your back can cause lumbosacral strain.  Also, anything that causes an excessive stretch of the muscles in the low back can cause this strain. This is typically seen when people exert themselves strenuously, fall, lift heavy objects, bend, or crouch repeatedly. RISK FACTORS  Physically demanding work.  Participation in pushing or pulling sports or sports that require a sudden twist of the back (tennis, golf, baseball).  Weight lifting.  Excessive lower back curvature.  Forward-tilted pelvis.  Weak back or abdominal muscles or both.  Tight hamstrings. SIGNS AND SYMPTOMS  Lumbosacral strain may cause pain in the area of your injury or pain that moves (radiates) down your leg.  DIAGNOSIS Your health care provider can often diagnose lumbosacral strain through a physical exam. In some cases, you may need tests such as X-ray exams.  TREATMENT  Treatment for your lower back injury depends on many factors that your clinician will have to evaluate. However, most treatment will include the use of anti-inflammatory medicines. HOME CARE INSTRUCTIONS   Avoid hard physical activities (tennis, racquetball, waterskiing) if you are not in proper physical condition for it. This may aggravate or create problems.  If you have a back problem, avoid sports requiring sudden body  movements. Swimming and walking are generally safer activities. °· Maintain good posture. °· Maintain a healthy weight. °· For acute conditions, you may put ice on the injured area. °¨ Put ice in a plastic bag. °¨ Place a towel between your skin and the bag. °¨ Leave the ice on for 20 minutes, 2-3 times a day. °· When the low back starts healing, stretching and strengthening exercises may be recommended. °SEEK MEDICAL CARE IF: °· Your back pain is getting worse. °· You experience severe back pain not relieved with medicines. °SEEK IMMEDIATE MEDICAL CARE IF:  °· You have numbness, tingling, weakness, or problems with the use of your arms or legs. °· There is a change in bowel or  bladder control. °· You have increasing pain in any area of the body, including your belly (abdomen). °· You notice shortness of breath, dizziness, or feel faint. °· You feel sick to your stomach (nauseous), are throwing up (vomiting), or become sweaty. °· You notice discoloration of your toes or legs, or your feet get very cold. °MAKE SURE YOU:  °· Understand these instructions. °· Will watch your condition. °· Will get help right away if you are not doing well or get worse. °Document Released: 03/08/2005 Document Revised: 06/03/2013 Document Reviewed: 01/15/2013 °Exit[employer]® Patient Information ©2015 [employer]. This information is not intended to replace advice given to you by your health care provider. Make sure you discuss any questions you have with your health care provider. ° °

## 2013-12-12 NOTE — ED Notes (Signed)
C/o  Back pain.  Hx of injury, arthritis, and sciatica.  In process of finding new pcp.  Pt states that pain and stiffness is worse in the morning.

## 2013-12-13 NOTE — ED Provider Notes (Signed)
Medical screening examination/treatment/procedure(s) were performed by non-physician practitioner and as supervising physician I was immediately available for consultation/collaboration.  Leslee Homeavid Deshonna Trnka, M.D.  Reuben Likesavid C Jermia Rigsby, MD 12/13/13 [phone]

## 2013-12-21 ENCOUNTER — Encounter (HOSPITAL_COMMUNITY): Payer: Self-pay | Admitting: Emergency Medicine

## 2013-12-21 ENCOUNTER — Emergency Department (HOSPITAL_COMMUNITY): Payer: Medicaid Other

## 2013-12-21 ENCOUNTER — Emergency Department (HOSPITAL_COMMUNITY)
Admission: EM | Admit: 2013-12-21 | Discharge: 2013-12-21 | Disposition: A | Discharge status: Home / Self Care | Payer: Medicaid Other | Attending: Dermatology | Admitting: Dermatology

## 2013-12-21 DIAGNOSIS — H53149 Visual discomfort, unspecified: Secondary | ICD-10-CM | POA: Diagnosis not present

## 2013-12-21 DIAGNOSIS — J189 Pneumonia, unspecified organism: Secondary | ICD-10-CM

## 2013-12-21 DIAGNOSIS — Z8739 Personal history of other diseases of the musculoskeletal system and connective tissue: Secondary | ICD-10-CM | POA: Insufficient documentation

## 2013-12-21 DIAGNOSIS — R42 Dizziness and giddiness: Secondary | ICD-10-CM | POA: Insufficient documentation

## 2013-12-21 DIAGNOSIS — R51 Headache: Secondary | ICD-10-CM | POA: Insufficient documentation

## 2013-12-21 DIAGNOSIS — R110 Nausea: Secondary | ICD-10-CM | POA: Diagnosis not present

## 2013-12-21 DIAGNOSIS — J159 Unspecified bacterial pneumonia: Secondary | ICD-10-CM | POA: Diagnosis not present

## 2013-12-21 DIAGNOSIS — R52 Pain, unspecified: Secondary | ICD-10-CM | POA: Diagnosis present

## 2013-12-21 DIAGNOSIS — F319 Bipolar disorder, unspecified: Secondary | ICD-10-CM | POA: Diagnosis not present

## 2013-12-21 DIAGNOSIS — Z79899 Other long term (current) drug therapy: Secondary | ICD-10-CM | POA: Diagnosis not present

## 2013-12-21 LAB — CBC WITH DIFFERENTIAL/PLATELET
Basophils Absolute: 0 10*3/uL (ref 0.0–0.1)
Basophils Relative: 0 % (ref 0–1)
EOS PCT: 0 % (ref 0–5)
Eosinophils Absolute: 0 10*3/uL (ref 0.0–0.7)
HEMATOCRIT: 36.2 % (ref 36.0–46.0)
Hemoglobin: 12.5 g/dL (ref 12.0–15.0)
LYMPHS ABS: 1 10*3/uL (ref 0.7–4.0)
LYMPHS PCT: 6 % — AB (ref 12–46)
MCH: 30.3 pg (ref 26.0–34.0)
MCHC: 34.5 g/dL (ref 30.0–36.0)
MCV: 87.9 fL (ref 78.0–100.0)
MONO ABS: 0.8 10*3/uL (ref 0.1–1.0)
Monocytes Relative: 5 % (ref 3–12)
NEUTROS ABS: 14.7 10*3/uL — AB (ref 1.7–7.7)
Neutrophils Relative %: 89 % — ABNORMAL HIGH (ref 43–77)
Platelets: 181 10*3/uL (ref 150–400)
RBC: 4.12 MIL/uL (ref 3.87–5.11)
RDW: 12.5 % (ref 11.5–15.5)
WBC: 16.5 10*3/uL — AB (ref 4.0–10.5)

## 2013-12-21 LAB — URINE MICROSCOPIC-ADD ON

## 2013-12-21 LAB — URINALYSIS, ROUTINE W REFLEX MICROSCOPIC
BILIRUBIN URINE: NEGATIVE
GLUCOSE, UA: NEGATIVE mg/dL
Hgb urine dipstick: NEGATIVE
Ketones, ur: NEGATIVE mg/dL
Nitrite: NEGATIVE
Protein, ur: NEGATIVE mg/dL
Specific Gravity, Urine: 1.008 (ref 1.005–1.030)
UROBILINOGEN UA: 0.2 mg/dL (ref 0.0–1.0)
pH: 8 (ref 5.0–8.0)

## 2013-12-21 LAB — COMPREHENSIVE METABOLIC PANEL
ALT: 25 U/L (ref 0–35)
AST: 26 U/L (ref 0–37)
Albumin: 4.1 g/dL (ref 3.5–5.2)
Alkaline Phosphatase: 64 U/L (ref 39–117)
Anion gap: 12 (ref 5–15)
BUN: 7 mg/dL (ref 6–23)
CALCIUM: 8.9 mg/dL (ref 8.4–10.5)
CHLORIDE: 101 meq/L (ref 96–112)
CO2: 23 meq/L (ref 19–32)
Creatinine, Ser: 0.77 mg/dL (ref 0.50–1.10)
GLUCOSE: 107 mg/dL — AB (ref 70–99)
Potassium: 3.9 mEq/L (ref 3.7–5.3)
SODIUM: 136 meq/L — AB (ref 137–147)
Total Bilirubin: 0.6 mg/dL (ref 0.3–1.2)
Total Protein: 7.6 g/dL (ref 6.0–8.3)

## 2013-12-21 MED ORDER — AZITHROMYCIN 250 MG PO TABS: ORAL_TABLET | ORAL | Status: DC

## 2013-12-21 MED ORDER — SODIUM CHLORIDE 0.9 % IV BOLUS (SEPSIS)
1000.0000 mL | Freq: Once | INTRAVENOUS | Status: AC
  Administered 2013-12-21: 1000 mL via INTRAVENOUS

## 2013-12-21 MED ORDER — HYDROCODONE-ACETAMINOPHEN 5-325 MG PO TABS: 1.0000 | ORAL_TABLET | Freq: Four times a day (QID) | ORAL | Status: DC | PRN

## 2013-12-21 MED ORDER — ONDANSETRON HCL 4 MG/2ML IJ SOLN
4.0000 mg | Freq: Once | INTRAMUSCULAR | Status: AC
  Administered 2013-12-21: 4 mg via INTRAVENOUS
  Filled 2013-12-21: qty 2

## 2013-12-21 MED ORDER — DEXTROSE 5 % IV SOLN
1.0000 g | Freq: Once | INTRAVENOUS | Status: AC
  Administered 2013-12-21: 1 g via INTRAVENOUS
  Filled 2013-12-21: qty 10

## 2013-12-21 MED ORDER — AZITHROMYCIN 250 MG PO TABS: ORAL_TABLET | ORAL | Status: AC

## 2013-12-21 MED ORDER — ACETAMINOPHEN 325 MG PO TABS
650.0000 mg | ORAL_TABLET | Freq: Once | ORAL | Status: AC
  Administered 2013-12-21: 650 mg via ORAL
  Filled 2013-12-21: qty 2

## 2013-12-21 MED ORDER — HYDROMORPHONE HCL PF 1 MG/ML IJ SOLN
1.0000 mg | Freq: Once | INTRAMUSCULAR | Status: AC
  Administered 2013-12-21: 1 mg via INTRAVENOUS
  Filled 2013-12-21: qty 1

## 2013-12-21 NOTE — ED Notes (Signed)
Pt up in room talking on phone

## 2013-12-21 NOTE — Discharge Instructions (Signed)
Take your pain medication as needed.  Take tylenol or motrin for fever.  Take azithromycin for pneumonia (first day take 2 tablets. Then take 1 tablet daily for the next 4 days).  Keep yourself hydrated. Drink at least six 8 ounce glasses of water daily. Call the wellness Center or any of the numbers on the resource guide to get a primary care physician    Pneumonia, Adult Pneumonia is an infection of the lungs.  CAUSES Pneumonia may be caused by bacteria or a virus. Usually, these infections are caused by breathing infectious particles into the lungs (respiratory tract). SYMPTOMS   Cough.  Fever.  Chest pain.  Increased rate of breathing.  Wheezing.  Mucus production. DIAGNOSIS  If you have the common symptoms of pneumonia, your caregiver will typically confirm the diagnosis with a chest X-ray. The X-ray will show an abnormality in the lung (pulmonary infiltrate) if you have pneumonia. Other tests of your blood, urine, or sputum may be done to find the specific cause of your pneumonia. Your caregiver may also do tests (blood gases or pulse oximetry) to see how well your lungs are working. TREATMENT  Some forms of pneumonia may be spread to other people when you cough or sneeze. You may be asked to wear a mask before and during your exam. Pneumonia that is caused by bacteria is treated with antibiotic medicine. Pneumonia that is caused by the influenza virus may be treated with an antiviral medicine. Most other viral infections must run their course. These infections will not respond to antibiotics.  PREVENTION A pneumococcal shot (vaccine) is available to prevent a common bacterial cause of pneumonia. This is usually suggested for:  People over 37 years old.  Patients on chemotherapy.  People with chronic lung problems, such as bronchitis or emphysema.  People with immune system problems. If you are over 65 or have a high risk condition, you may receive the pneumococcal  vaccine if you have not received it before. In some countries, a routine influenza vaccine is also recommended. This vaccine can help prevent some cases of pneumonia.You may be offered the influenza vaccine as part of your care. If you smoke, it is time to quit. You may receive instructions on how to stop smoking. Your caregiver can provide medicines and counseling to help you quit. HOME CARE INSTRUCTIONS   Cough suppressants may be used if you are losing too much rest. However, coughing protects you by clearing your lungs. You should avoid using cough suppressants if you can.  Your caregiver may have prescribed medicine if he or she thinks your pneumonia is caused by a bacteria or influenza. Finish your medicine even if you start to feel better.  Your caregiver may also prescribe an expectorant. This loosens the mucus to be coughed up.  Only take over-the-counter or prescription medicines for pain, discomfort, or fever as directed by your caregiver.  Do not smoke. Smoking is a common cause of bronchitis and can contribute to pneumonia. If you are a smoker and continue to smoke, your cough may last several weeks after your pneumonia has cleared.  A cold steam vaporizer or humidifier in your room or home may help loosen mucus.  Coughing is often worse at night. Sleeping in a semi-upright position in a recliner or using a couple pillows under your head will help with this.  Get rest as you feel it is needed. Your body will usually let you know when you need to rest. SEEK IMMEDIATE MEDICAL CARE  IF:   Your illness becomes worse. This is especially true if you are elderly or weakened from any other disease.  You cannot control your cough with suppressants and are losing sleep.  You begin coughing up blood.  You develop pain which is getting worse or is uncontrolled with medicines.  You have a fever.  Any of the symptoms which initially brought you in for treatment are getting worse rather  than better.  You develop shortness of breath or chest pain. MAKE SURE YOU:   Understand these instructions.  Will watch your condition.  Will get help right away if you are not doing well or get worse. Document Released: 05/29/2005 Document Revised: 08/21/2011 Document Reviewed: 08/18/2010 [facility] Patient Information 2015 [place], [place]. This information is not intended to replace advice given to you by your health care provider. Make sure you discuss any questions you have with your health care provider.  Emergency Department Resource Guide 1) Find a Doctor and Pay Out of Pocket Although you won't have to find out who is covered by your insurance plan, it is a good idea to ask around and get recommendations. You will then need to call the office and see if the doctor you have chosen will accept you as a new patient and what types of options they offer for patients who are self-pay. Some doctors offer discounts or will set up payment plans for their patients who do not have insurance, but you will need to ask so you aren't surprised when you get to your appointment.  2) Contact Your Local Health Department Not all health departments have doctors that can see patients for sick visits, but many do, so it is worth a call to see if yours does. If you don't know where your local health department is, you can check in your phone book. The CDC also has a tool to help you locate your state's health department, and many state websites also have listings of all of their local health departments.  3) Find a Walk-in Clinic If your illness is not likely to be very severe or complicated, you may want to try a walk in clinic. These are popping up all over the country in pharmacies, drugstores, and shopping centers. They're usually staffed by nurse practitioners or physician assistants that have been trained to treat common illnesses and complaints. They're usually fairly quick and inexpensive. However, if  you have serious medical issues or chronic medical problems, these are probably not your best option.  No Primary Care Doctor: - Call Health Connect at  [phone] - they can help you locate a primary care doctor that  accepts your insurance, provides certain services, etc. - Physician Referral Service- [phone]  Chronic Pain Problems: Organization         Address  Phone   Notes  [facility]  [phone] Patients need to be referred by their primary care doctor.   Medication Assistance: Organization         Address  Phone   Notes  [facility] [address] [place], [place] [address] [phone] --Must be a resident of [facility] -- Must have NO insurance coverage whatsoever (no Medicaid/ Medicare, etc.) -- The pt. MUST have a primary care doctor that directs their care regularly and follows them in the community   MedAssist  [phone]   [employer]  [phone]    Agencies that provide inexpensive medical care: Organization  Address  Phone   Notes  [facility]  [phone](336) [phone]   [facility]    [phone]   [facility] [address] [place], [place] [phone]) [phone]   [facility] [address], [place] [phone](336) [phone]   [facility]anned Parenthood    [phone]   [facility]    [phone]   [facility]  [address], [place] Phone:  [phone], Fax:  [phone] Hours of Operation:  9 am - 6 pm, M-F.  Also accepts Medicaid/Medicare and self-pay.  [facility]  [address], [place] Phone: [phone], Fax: [phone]. Hours of Operation:  8:30 am - 5:30 pm, M-F.  Also accepts Medicaid and self-pay.  [facility] [place] [address], [place] Phone: [phone]) [phone]   [facility] [address], [place], [place] [phone], Ext. [phone] Mondays & Thursdays: 7-9 AM.  First 15 patients are seen on a first come, first serve basis.    Medicaid-accepting [facility] Providers:  Organization         Address  Phone   Notes  [facility] [address], [place] [phone] Also accepts self-pay patients.  [facility] [address], [place]  [phone]) [phone]   [facility] [address], [address], [place] [phone](336) [phone]   [facility] [address] [place] [address], [place] [phone]) [phone]   Renaye RakersVeita Bland [address], [place]   [phone]) [phone] Only accepts [place] Access [place]icaid patients after they have their name applied to their card.   Self-Pay (no insurance) in [facility]:  Organization         Address  Phone   Notes  Sickle Cell Patients, [facility] [address], [place] [phone]) [phone]   [facility] [address], [place] [phone]) [phone]   [facility] [place]  [address],  [phone](336) [phone]   [facility]/Dr. Osei-Bonsu  [address] [place] [address], [place] or [id number], [place] [phone] Phone number for both [place] and [place] locations is the same.  [facility] [address], [place] [phone]) [phone]   [facility] [place] [address] [place] [address], [place] or [address] [phone](336) [phone]   [facility] [address], [place] [phone]) [phone], phone; [phone](336) [phone], fax Sees patients 1st and 3rd Saturday of every month.  Must not qualify for public or private insurance (i.e. Medicaid, Medicare, [place] Health Choice, Veterans' Benefits)  Household income should be no more than 200% of the poverty level The clinic cannot treat you if you are pregnant or think you are  pregnant  Sexually transmitted diseases are not treated at the clinic.    Dental Care: Organization         Address  Phone  Notes  [facility] [address], [place] [phone]) [phone] Accepts children up to age 10521 who are enrolled in [place]icaid or [place] Health Choice; pregnant women with a Medicaid card; and children who have applied for Medicaid or [place] Health Choice, but were declined, whose parents can pay a reduced fee at time  of service.  [facility] of [facility] [place]  [address], [place] [phone](336) [phone] Accepts children up to age 37 who are enrolled in [place]icaid or [place] Health Choice; pregnant women with a Medicaid card; and children who have applied for Medicaid or [place] Health Choice, but were declined, whose parents can pay a reduced fee at time of service.  [facility]  [address], [place] [phone]) [phone] Patients are seen by appointment only. Walk-ins are not accepted. [facility] will see patients 37 years of age and older. Monday - Tuesday (8am-5pm) Most Wednesdays (8:30-5pm) $30 per visit, cash only  [facility]  [address], [facility] [place] [place] [phone]) [phone] Patients are seen by appointment only. Walk-ins are not accepted. [facility] will see patients 37 years of age and older. One Wednesday Evening (Monthly: Volunteer Based).  $30 per visit, cash only  [employer] [facility]  [phone] for adults; Children under age 134, call Graduate Pediatric Dentistry at [phone]. Children aged 344-14, please call [phone] to request a pediatric application.  Dental services are provided in all areas of dental care including fillings, crowns and bridges, complete and partial dentures, implants, gum treatment, root canals, and extractions. Preventive care is also provided. Treatment is provided to both adults and  children. Patients are selected via a lottery and there is often a waiting list.   [facility] [address], [place]  [phone]) [phone] [email or web address]   [facility] [address], [place], [place] [phone], Ext. [phone] Second and Fourth Thursday of each month, opens at 6:30 AM; Clinic ends at 9 AM.  Patients are seen on a first-come first-served basis, and a limited number are seen during each clinic.   [facility]  [address], [place], [place] [phone](336) [phone]   Eligibility Requirements You must have lived in [place], [place], or [place] counties for at least the last three months.   You cannot be eligible for state or federal sponsored National Cityhealthcare insurance, including CIGNAVeterans Administration, [place]icaid, or [employer] EntertainmentMedicare.   You generally cannot be eligible for healthcare insurance through your employer.    How to apply: Eligibility screenings are held every Tuesday and Wednesday afternoon from 1:00 pm until 4:00 pm. You do not need an appointment for the interview!  [facility] [address], [place], [place] [phone]   [facility]  [phone]   [facility]  [phone]   [facility]  [phone]    Behavioral Health Resources in the Community: Intensive Outpatient Programs Organization         Address  Phone  Notes  [facility] [address], [place], [place] [phone]   [facility] [address], [place], [place] [phone]   [facility] [address], [place], [place]  [phone]   [facility] [address],  [place], [place] [phone] or [phone]   Substance Abuse Resources Organization         Address  Phone  Notes  [facility]  [phone]   [facility]  [phone]   [facility]  [phone]    [facility]  [phone]   Residential & Outpatient Substance Abuse Program  [phone]   Psychological Services Organization         Address  Phone  Notes  [facility]  [phone]- [phone]   [facility]  [phone]- [phone]   [facility] [address], [place] [phone] or [phone]    Mobile Crisis Teams Organization         Address  Phone  Notes  [facility], Mobile Crisis Care Unit  [phone]   [facility]  [address] [place], [place] [phone]   Doristine LocksSharon DeEsch [address] [place] [phone]    Self-Help/Support Groups Organization         Address  Phone             Notes  [facility]oc[facility] of [place] - variety of support groups  [phone] Call for more information  Narcotics Anonymous (NA), Caring [facility] [address], [place]  2 meetings at this location   Statisticianesidential Treatment Programs Organization         Address  Phone  Notes  [facility] [address],    [place]  [phone]   [facility]  [address], [place] [phone], [place], [place] [phone]   [facility] [address], [place] [phone] Admissions: 8am-3pm M-F  [facility] [address]    [place], [place] [phone]   [facility] [address], [place], [place] [phone]   [facility] [address]  [place], [place] [phone]   [facility] - Intensive Outpatient [address], [place], [place] [phone]   [facility] ([facility] [address]  [place], [place] [phone] or [phone]   [facility] (RT[facility]) [address] [place], [place] [phone] Accepts Medicaid  [facility] [address]  [place] [phone] Substance Abuse/Addiction Treatment   [facility]  Behavioral Health Resources Organization         Address  Phone  Notes  [facility]  [phone]) [phone]   Angie FavaJulie Brannon, PhD [address], [address] [place], [place]   [phone](336) [phone] or [phone](336) [phone]   [facility]   [address] [place], [place] [phone]) [phone]   [facility] [address], [place], [place] [phone]) [phone] Insurance/Medicaid/sponsorship through [facility]  [facility] Families [address]                                    [place], [place] [phone] Therapy/tele-psych/case  [facility] [address]   [place], [place] [phone](336) [phone]    Dr. Lolly MustacheArfeen  [phone](336) [phone]   [facility]  [employer] [facility] 1) [address], [place] 2) [address], [place] 3)  [address], [place] [phone](336) [phone]  [phone](336) [phone]   [facility] Child Abuse Hotline [phone]) [phone] or [phone]) [phone] (After Hours)

## 2013-12-21 NOTE — ED Notes (Signed)
Pt reports hx of arthritis and sciatica. Reports she woke up with body aches and fever. Temperature 100.8 oral. Pain 10/10. Ambulatory.

## 2013-12-21 NOTE — ED Provider Notes (Signed)
Patient alert Glasgow Coma Score 15 no distress is very discharge plan prescription Z-Pak, prescription Norco. Referral to wellness Center in resource guide chest x-ray viewed by me. Results for orders placed during the hospital encounter of 12/21/13  CBC WITH DIFFERENTIAL      Result Value Ref Range   WBC 16.5 (*) 4.0 - 10.5 K/uL   RBC 4.12  3.87 - 5.11 MIL/uL   Hemoglobin 12.5  12.0 - 15.0 g/dL   HCT 36.636.2  44.036.0 - 34.746.0 %   MCV 87.9  78.0 - 100.0 fL   MCH 30.3  26.0 - 34.0 pg   MCHC 34.5  30.0 - 36.0 g/dL   RDW 42.512.5  95.611.5 - 38.715.5 %   Platelets 181  150 - 400 K/uL   Neutrophils Relative % 89 (*) 43 - 77 %   Neutro Abs 14.7 (*) 1.7 - 7.7 K/uL   Lymphocytes Relative 6 (*) 12 - 46 %   Lymphs Abs 1.0  0.7 - 4.0 K/uL   Monocytes Relative 5  3 - 12 %   Monocytes Absolute 0.8  0.1 - 1.0 K/uL   Eosinophils Relative 0  0 - 5 %   Eosinophils Absolute 0.0  0.0 - 0.7 K/uL   Basophils Relative 0  0 - 1 %   Basophils Absolute 0.0  0.0 - 0.1 K/uL  COMPREHENSIVE METABOLIC PANEL      Result Value Ref Range   Sodium 136 (*) 137 - 147 mEq/L   Potassium 3.9  3.7 - 5.3 mEq/L   Chloride 101  96 - 112 mEq/L   CO2 23  19 - 32 mEq/L   Glucose, Bld 107 (*) 70 - 99 mg/dL   BUN 7  6 - 23 mg/dL   Creatinine, Ser 5.640.77  0.50 - 1.10 mg/dL   Calcium 8.9  8.4 - 33.210.5 mg/dL   Total Protein 7.6  6.0 - 8.3 g/dL   Albumin 4.1  3.5 - 5.2 g/dL   AST 26  0 - 37 U/L   ALT 25  0 - 35 U/L   Alkaline Phosphatase 64  39 - 117 U/L   Total Bilirubin 0.6  0.3 - 1.2 mg/dL   GFR calc non Af Amer >90  >90 mL/min   GFR calc Af Amer >90  >90 mL/min   Anion gap 12  5 - 15  URINALYSIS, ROUTINE W REFLEX MICROSCOPIC      Result Value Ref Range   Color, Urine YELLOW  YELLOW   APPearance CLOUDY (*) CLEAR   Specific Gravity, Urine 1.008  1.005 - 1.030   pH 8.0  5.0 - 8.0   Glucose, UA NEGATIVE  NEGATIVE mg/dL   Hgb urine dipstick NEGATIVE  NEGATIVE   Bilirubin Urine NEGATIVE  NEGATIVE   Ketones, ur NEGATIVE  NEGATIVE mg/dL   Protein, ur NEGATIVE  NEGATIVE mg/dL   Urobilinogen, UA 0.2  0.0 - 1.0 mg/dL   Nitrite NEGATIVE  NEGATIVE   Leukocytes, UA MODERATE (*) NEGATIVE  URINE MICROSCOPIC-ADD ON      Result Value Ref Range   Squamous Epithelial / LPF FEW (*) RARE   WBC, UA 3-6  <3 WBC/hpf   Dg Chest 2 View  12/21/2013   CLINICAL DATA:  Fever and cough, smoker, generalized body aches  EXAM: CHEST  2 VIEW  COMPARISON:  11/08/2013  FINDINGS: Normal heart size, mediastinal contours and pulmonary vascularity.  Minimal chronic peribronchial thickening.  Subtle patchy LEFT perihilar infiltrate likely pneumonia in LEFT  lower lobe.  Remaining lungs clear.  No pleural effusion or pneumothorax.  Surgical clips RIGHT upper quadrant question cholecystectomy.  No acute osseous findings.  IMPRESSION: Subtle LEFT lower lobe infiltrate consistent with pneumonia.   Electronically Signed   By: Ulyses Southward M.D.   On: 12/21/2013 15:30    Dx community-acquired pneumonia .   Doug Sou, MD 12/21/13 1725

## 2013-12-21 NOTE — ED Provider Notes (Signed)
CSN: [id number]     Arrival date & time 12/21/13  1338 History   First MD Initiated Contact with Patient 12/21/13 1440     Chief Complaint  Patient presents with  . Generalized Body Aches  . Fever     (Consider location/radiation/quality/duration/timing/severity/associated sxs/prior Treatment) HPI  38 yo female with hx of sciatica is here with fever Tmax 103 since 9 AM this morning and body ache. She woke up this morning to go to church and felt "hot", checked Temp and it was 101. She still managed to go to church with her child. She came back home and took some rest but she continued to feel malaise and had temp increasing to 103. She is also having 10/10 pain on her lower back radiating to her buttock, similar to her sciatica pain but worse. She also has new left upper back pain. She has pain around her eyes. Feels nausea, denies emesis. No cough. No dysuria, no abdominal pain. She feels dehydrated despite drinking 8-10 glasses of water. Denies chest pain or sob. Denies weakness, numbness, tingling.   Past Medical History  Diagnosis Date  . Bipolar 1 disorder   . Arthritis    Past Surgical History  Procedure Laterality Date  . Vaginal lift     . Bladder tuck     History reviewed. No pertinent family history. History  Substance Use Topics  . Smoking status: Never Smoker   . Smokeless tobacco: Not on file  . Alcohol Use: No   OB History   Grav Para Term Preterm Abortions TAB SAB Ect Mult Living                 Review of Systems  Constitutional: Positive for fever, chills, diaphoresis and fatigue.  HENT: Negative for congestion, ear discharge, ear pain, hearing loss, mouth sores, nosebleeds, rhinorrhea, sinus pressure, sore throat and tinnitus.   Eyes: Positive for photophobia and pain. Negative for redness, itching and visual disturbance.  Respiratory: Negative.  Negative for cough, chest tightness, shortness of breath and wheezing.   Cardiovascular: Negative for chest pain  and leg swelling.  Gastrointestinal: Positive for nausea. Negative for vomiting, abdominal pain, diarrhea, constipation, blood in stool and abdominal distention.  Endocrine: Negative.   Genitourinary: Negative.   Musculoskeletal: Positive for back pain, myalgias and neck pain.  Skin: Negative.   Allergic/Immunologic: Negative.   Neurological: Positive for dizziness and headaches. Negative for tremors, seizures, syncope, facial asymmetry, speech difficulty, weakness and numbness.  Hematological: Negative.   Psychiatric/Behavioral: Negative.       Allergies  Review of patient's allergies indicates no known allergies.  Home Medications   Prior to Admission medications   Medication Sig Start Date End Date Taking? Authorizing Provider  levonorgestrel (MIRENA) 20 MCG/24HR IUD 1 each by Intrauterine route once.   Yes Historical Provider, MD  lithium carbonate 300 MG capsule Take 300 mg by mouth 3 (three) times daily with meals.  11/17/13 02/15/14 Yes Historical Provider, MD  QUEtiapine (SEROQUEL) 50 MG tablet Take 50-150 mg by mouth 3 (three) times daily. 50 mg at breakfast and lunch.  150 mg at bedtime. 11/24/13  Yes Historical Provider, MD  traMADol (ULTRAM) 50 MG tablet Take 50 mg by mouth 3 (three) times daily as needed for moderate pain.   Yes Historical Provider, MD   BP 95/62  Pulse 107  Temp(Src) 100.8 F (38.2 C) (Oral)  Resp 16  SpO2 97% Physical Exam  Constitutional: She is oriented to person, place, and time.  She appears well-developed and well-nourished. She appears distressed.  HENT:  Head: Normocephalic and atraumatic.  Right Ear: External ear normal.  Left Ear: External ear normal.  Eyes: Conjunctivae and EOM are normal. Pupils are equal, round, and reactive to light. Right eye exhibits no discharge. Left eye exhibits no discharge. Right conjunctiva is not injected. Right conjunctiva has no hemorrhage. Left conjunctiva is not injected. Left conjunctiva has no hemorrhage.    Photophobia without tenderness to palpation on both eyes.  Neck: Normal range of motion and full passive range of motion without pain. Neck supple. No JVD present. Muscular tenderness present. No spinous process tenderness present. No Brudzinski's sign and no Kernig's sign noted.  Musculoskeletal: Normal range of motion. She exhibits no edema.       Lumbar back: She exhibits tenderness. She exhibits no swelling and no edema.       Back:  No tenderness on the spinous processes.  Has muscle tenderness on lower back and both legs. Has muscle tenderness on left upper back.  Neurological: She is alert and oriented to person, place, and time. She has normal strength. No cranial nerve deficit or sensory deficit.  Skin: She is diaphoretic.    ED Course  Procedures (including critical care time) Labs Review Labs Reviewed - No data to display  Imaging Review No results found.   EKG Interpretation None      Vitals here Temp was 100.8. BP is on the lower side 95/62. Tachycardic 107. Will do fever workup CBC, CMP, UA, CXR.  bolused 1L, treated pain 1mg  IV dilaudid.  CXR shows subtle left lower lobe infiltrate.  Will treat CAP with zeepec outpatient. Will give one dose of rocephin here.  MDM   Final diagnoses:  None   Diagnosed with CAP. Will treat with azithromycinx5 days. Also gave Norco for her severe pain.

## 2013-12-22 ENCOUNTER — Encounter (HOSPITAL_COMMUNITY): Payer: Self-pay | Admitting: Emergency Medicine

## 2013-12-22 ENCOUNTER — Emergency Department (HOSPITAL_COMMUNITY)
Admission: EM | Admit: 2013-12-22 | Discharge: 2013-12-22 | Disposition: A | Discharge status: Home / Self Care | Payer: Medicaid Other | Attending: Emergency Medicine | Admitting: Emergency Medicine

## 2013-12-22 ENCOUNTER — Emergency Department (HOSPITAL_COMMUNITY): Payer: Medicaid Other

## 2013-12-22 DIAGNOSIS — Z79899 Other long term (current) drug therapy: Secondary | ICD-10-CM | POA: Diagnosis not present

## 2013-12-22 DIAGNOSIS — F319 Bipolar disorder, unspecified: Secondary | ICD-10-CM | POA: Insufficient documentation

## 2013-12-22 DIAGNOSIS — Z8739 Personal history of other diseases of the musculoskeletal system and connective tissue: Secondary | ICD-10-CM | POA: Diagnosis not present

## 2013-12-22 DIAGNOSIS — J189 Pneumonia, unspecified organism: Secondary | ICD-10-CM

## 2013-12-22 DIAGNOSIS — Z792 Long term (current) use of antibiotics: Secondary | ICD-10-CM | POA: Insufficient documentation

## 2013-12-22 DIAGNOSIS — J159 Unspecified bacterial pneumonia: Secondary | ICD-10-CM | POA: Diagnosis not present

## 2013-12-22 DIAGNOSIS — R042 Hemoptysis: Secondary | ICD-10-CM

## 2013-12-22 LAB — CBC WITH DIFFERENTIAL/PLATELET
BASOS PCT: 0 % (ref 0–1)
Basophils Absolute: 0 10*3/uL (ref 0.0–0.1)
EOS ABS: 0.1 10*3/uL (ref 0.0–0.7)
EOS PCT: 0 % (ref 0–5)
HCT: 38.3 % (ref 36.0–46.0)
HEMOGLOBIN: 12.9 g/dL (ref 12.0–15.0)
LYMPHS ABS: 1.2 10*3/uL (ref 0.7–4.0)
Lymphocytes Relative: 10 % — ABNORMAL LOW (ref 12–46)
MCH: 30.4 pg (ref 26.0–34.0)
MCHC: 33.7 g/dL (ref 30.0–36.0)
MCV: 90.1 fL (ref 78.0–100.0)
MONO ABS: 0.8 10*3/uL (ref 0.1–1.0)
MONOS PCT: 6 % (ref 3–12)
Neutro Abs: 10.3 10*3/uL — ABNORMAL HIGH (ref 1.7–7.7)
Neutrophils Relative %: 84 % — ABNORMAL HIGH (ref 43–77)
Platelets: 190 10*3/uL (ref 150–400)
RBC: 4.25 MIL/uL (ref 3.87–5.11)
RDW: 12.8 % (ref 11.5–15.5)
WBC: 12.3 10*3/uL — ABNORMAL HIGH (ref 4.0–10.5)

## 2013-12-22 LAB — BASIC METABOLIC PANEL
Anion gap: 13 (ref 5–15)
BUN: 7 mg/dL (ref 6–23)
CO2: 25 mEq/L (ref 19–32)
CREATININE: 0.77 mg/dL (ref 0.50–1.10)
Calcium: 8.8 mg/dL (ref 8.4–10.5)
Chloride: 97 mEq/L (ref 96–112)
GLUCOSE: 96 mg/dL (ref 70–99)
Potassium: 4.2 mEq/L (ref 3.7–5.3)
Sodium: 135 mEq/L — ABNORMAL LOW (ref 137–147)

## 2013-12-22 MED ORDER — ACETAMINOPHEN ER 650 MG PO TBCR: 650.0000 mg | EXTENDED_RELEASE_TABLET | Freq: Three times a day (TID) | ORAL | Status: AC | PRN

## 2013-12-22 MED ORDER — HYDROCODONE-ACETAMINOPHEN 5-325 MG PO TABS
1.0000 | ORAL_TABLET | Freq: Once | ORAL | Status: AC
  Administered 2013-12-22: 2 via ORAL
  Filled 2013-12-22: qty 2

## 2013-12-22 MED ORDER — BENZONATATE 100 MG PO CAPS: 100.0000 mg | ORAL_CAPSULE | Freq: Three times a day (TID) | ORAL | Status: AC

## 2013-12-22 MED ORDER — OXYCODONE-ACETAMINOPHEN 5-325 MG PO TABS
1.0000 | ORAL_TABLET | Freq: Once | ORAL | Status: AC
Start: 2013-12-22 — End: 2013-12-22
  Administered 2013-12-22: 1 via ORAL
  Filled 2013-12-22: qty 1

## 2013-12-22 MED ORDER — PREDNISONE 50 MG PO TABS: 50.0000 mg | ORAL_TABLET | Freq: Every day | ORAL | Status: AC

## 2013-12-22 NOTE — ED Notes (Addendum)
[facility] Pneumonia in left lung. Today woke up with pain in right side and back and coughing up half dollar amount of blood. Breath sounds clear on right, slightly diminished on left. Pt states, "I am unsure if thepain is from my sciatica, arthritis or pneumonia." Sats WNL on RA. [facility], she was prescribed Vicodin, unable to fill it.

## 2013-12-22 NOTE — Discharge Instructions (Signed)
Please take the antibiotics as prescribed. You have a pneumonia - and you should respond to the antibiotics. IF HOWEVER you dont improve despite full antibiotic course - return to the ER.  YOU CAN TAKE ULTRAM 100 MG EVERY 6-8 HOURS instead of 50 mg tablets. DON'T TAKE MORE THAN 400 MG A DAY.   Pneumonia, Adult Pneumonia is an infection of the lungs.  CAUSES Pneumonia may be caused by bacteria or a virus. Usually, these infections are caused by breathing infectious particles into the lungs (respiratory tract). SYMPTOMS   Cough.  Fever.  Chest pain.  Increased rate of breathing.  Wheezing.  Mucus production. DIAGNOSIS  If you have the common symptoms of pneumonia, your caregiver will typically confirm the diagnosis with a chest X-ray. The X-ray will show an abnormality in the lung (pulmonary infiltrate) if you have pneumonia. Other tests of your blood, urine, or sputum may be done to find the specific cause of your pneumonia. Your caregiver may also do tests (blood gases or pulse oximetry) to see how well your lungs are working. TREATMENT  Some forms of pneumonia may be spread to other people when you cough or sneeze. You may be asked to wear a mask before and during your exam. Pneumonia that is caused by bacteria is treated with antibiotic medicine. Pneumonia that is caused by the influenza virus may be treated with an antiviral medicine. Most other viral infections must run their course. These infections will not respond to antibiotics.  PREVENTION A pneumococcal shot (vaccine) is available to prevent a common bacterial cause of pneumonia. This is usually suggested for:  People over 42 years old.  Patients on chemotherapy.  People with chronic lung problems, such as bronchitis or emphysema.  People with immune system problems. If you are over 65 or have a high risk condition, you may receive the pneumococcal vaccine if you have not received it before. In some countries, a  routine influenza vaccine is also recommended. This vaccine can help prevent some cases of pneumonia.You may be offered the influenza vaccine as part of your care. If you smoke, it is time to quit. You may receive instructions on how to stop smoking. Your caregiver can provide medicines and counseling to help you quit. HOME CARE INSTRUCTIONS   Cough suppressants may be used if you are losing too much rest. However, coughing protects you by clearing your lungs. You should avoid using cough suppressants if you can.  Your caregiver may have prescribed medicine if he or she thinks your pneumonia is caused by a bacteria or influenza. Finish your medicine even if you start to feel better.  Your caregiver may also prescribe an expectorant. This loosens the mucus to be coughed up.  Only take over-the-counter or prescription medicines for pain, discomfort, or fever as directed by your caregiver.  Do not smoke. Smoking is a common cause of bronchitis and can contribute to pneumonia. If you are a smoker and continue to smoke, your cough may last several weeks after your pneumonia has cleared.  A cold steam vaporizer or humidifier in your room or home may help loosen mucus.  Coughing is often worse at night. Sleeping in a semi-upright position in a recliner or using a couple pillows under your head will help with this.  Get rest as you feel it is needed. Your body will usually let you know when you need to rest. SEEK IMMEDIATE MEDICAL CARE IF:   Your illness becomes worse. This is especially true if  you are elderly or weakened from any other disease.  You cannot control your cough with suppressants and are losing sleep.  You begin coughing up blood.  You develop pain which is getting worse or is uncontrolled with medicines.  You have a fever.  Any of the symptoms which initially brought you in for treatment are getting worse rather than better.  You develop shortness of breath or chest  pain. MAKE SURE YOU:   Understand these instructions.  Will watch your condition.  Will get help right away if you are not doing well or get worse. Document Released: 05/29/2005 Document Revised: 08/21/2011 Document Reviewed: 08/18/2010 [facility] Patient Information 2015 [employer], [place]. This information is not intended to replace advice given to you by your health care provider. Make sure you discuss any questions you have with your health care provider.

## 2013-12-24 NOTE — ED Provider Notes (Signed)
I saw and evaluated the patient, reviewed the resident's note and I agree with the findings and plan.  Pt c/o fever, non prod cough, body aches, intermittent gradual onset headaches, for the past couple days. No sob. No chest pain. abd soft nt. Labs. Cxr. Urine.    Suzi RootsKevin E Chrisann Melaragno, MD 12/24/13 [phone]

## 2013-12-31 NOTE — ED Provider Notes (Signed)
CSN: [id number]     Arrival date & time 12/22/13  1547 History   First MD Initiated Contact with Patient 12/22/13 1941     Chief Complaint  Patient presents with  . Hemoptysis  . Pneumonia     (Consider location/radiation/quality/duration/timing/severity/associated sxs/prior Treatment) HPI Comments: 38 y/o F present to the ER with cc of bloody phlegm and chest pain. Recent diagnosis of CAP, and started on AZT. PT has no n/v/f/c. She has some dyspnea, when exerting herself. There is chest pain with cough. Pt has no serious medical hx and denies any chronic cardiopulmonary disease.  Patient is a 38 y.o. female presenting with pneumonia. The history is provided by the patient and medical records.  Pneumonia Associated symptoms include chest pain and shortness of breath. Pertinent negatives include no abdominal pain and no headaches.    Past Medical History  Diagnosis Date  . Bipolar 1 disorder   . Arthritis    Past Surgical History  Procedure Laterality Date  . Vaginal lift     . Bladder tuck     History reviewed. No pertinent family history. History  Substance Use Topics  . Smoking status: Never Smoker   . Smokeless tobacco: Not on file  . Alcohol Use: No   OB History   Grav Para Term Preterm Abortions TAB SAB Ect Mult Living                 Review of Systems  Constitutional: Negative for activity change.  Respiratory: Positive for cough and shortness of breath. Negative for wheezing.   Cardiovascular: Positive for chest pain.  Gastrointestinal: Negative for nausea, vomiting and abdominal pain.  Genitourinary: Negative for dysuria.  Musculoskeletal: Negative for neck pain.  Neurological: Negative for headaches.      Allergies  Review of patient's allergies indicates no known allergies.  Home Medications   Prior to Admission medications   Medication Sig Start Date End Date Taking? Authorizing Provider  azithromycin (ZITHROMAX Z-PAK) 250 MG tablet Take 2  tablets at once and then 1 tablet daily for 4 more days. 12/21/13  Yes Tasrif Ahmed, MD  levonorgestrel (MIRENA) 20 MCG/24HR IUD 1 each by Intrauterine route once.   Yes Historical Provider, MD  lithium carbonate 300 MG capsule Take 300 mg by mouth 3 (three) times daily with meals.  11/17/13 02/15/14 Yes Historical Provider, MD  QUEtiapine (SEROQUEL) 50 MG tablet Take 50-150 mg by mouth 3 (three) times daily. 50 mg at breakfast and lunch.  150 mg at bedtime. 11/24/13  Yes Historical Provider, MD  traMADol (ULTRAM) 50 MG tablet Take 50 mg by mouth 3 (three) times daily as needed for moderate pain.   Yes Historical Provider, MD  acetaminophen (TYLENOL 8 HOUR) 650 MG CR tablet Take 1 tablet (650 mg total) by mouth every 8 (eight) hours as needed for pain. 12/22/13   Derwood Kaplan, MD  benzonatate (TESSALON) 100 MG capsule Take 1 capsule (100 mg total) by mouth every 8 (eight) hours. 12/22/13   Derwood Kaplan, MD  predniSONE (DELTASONE) 50 MG tablet Take 1 tablet (50 mg total) by mouth daily. 12/22/13   Riham Polyakov, MD   BP 100/60  Pulse 74  Temp(Src) 98.5 F (36.9 C)  Resp 18  Wt 149 lb 1 oz (67.614 kg)  SpO2 98% Physical Exam  Nursing note and vitals reviewed. Constitutional: She is oriented to person, place, and time. She appears well-developed and well-nourished.  HENT:  Head: Normocephalic and atraumatic.  Eyes: EOM are normal.  Pupils are equal, round, and reactive to light.  Neck: Neck supple.  Cardiovascular: Normal rate, regular rhythm and normal heart sounds.   No murmur heard. Pulmonary/Chest: Effort normal. No respiratory distress. She has no wheezes.  Abdominal: Soft. She exhibits no distension. There is no tenderness. There is no rebound and no guarding.  Musculoskeletal: She exhibits edema.  Neurological: She is alert and oriented to person, place, and time.  Skin: Skin is warm and dry.    ED Course  Procedures (including critical care time) Labs Review Labs Reviewed  CBC  WITH DIFFERENTIAL - Abnormal; Notable for the following:    WBC 12.3 (*)    Neutrophils Relative % 84 (*)    Neutro Abs 10.3 (*)    Lymphocytes Relative 10 (*)    All other components within normal limits  BASIC METABOLIC PANEL - Abnormal; Notable for the following:    Sodium 135 (*)    All other components within normal limits    Imaging Review No results found.   EKG Interpretation None      MDM   Final diagnoses:  CAP (community acquired pneumonia)  Hemoptysis    Pt comes in with hemoptysis. VSS - no SIRs at arrival, and O2 sats are WNL as well.  Suspect that the hemoptysis is a result of CAP. There is no hx of DVT or PE, and the exam shows no signs of DVT.   Derwood KaplanAnkit Rosealynn Mateus, MD 12/31/13 [phone]

## 2014-09-09 IMAGING — CR DG CHEST 2V
2 series · 2 of 2 positions shown · non-contrast
Comparison: 11/08/2013

CLINICAL DATA: Fever and cough, smoker, generalized body aches

EXAM:
CHEST  2 VIEW

[w chest pa]
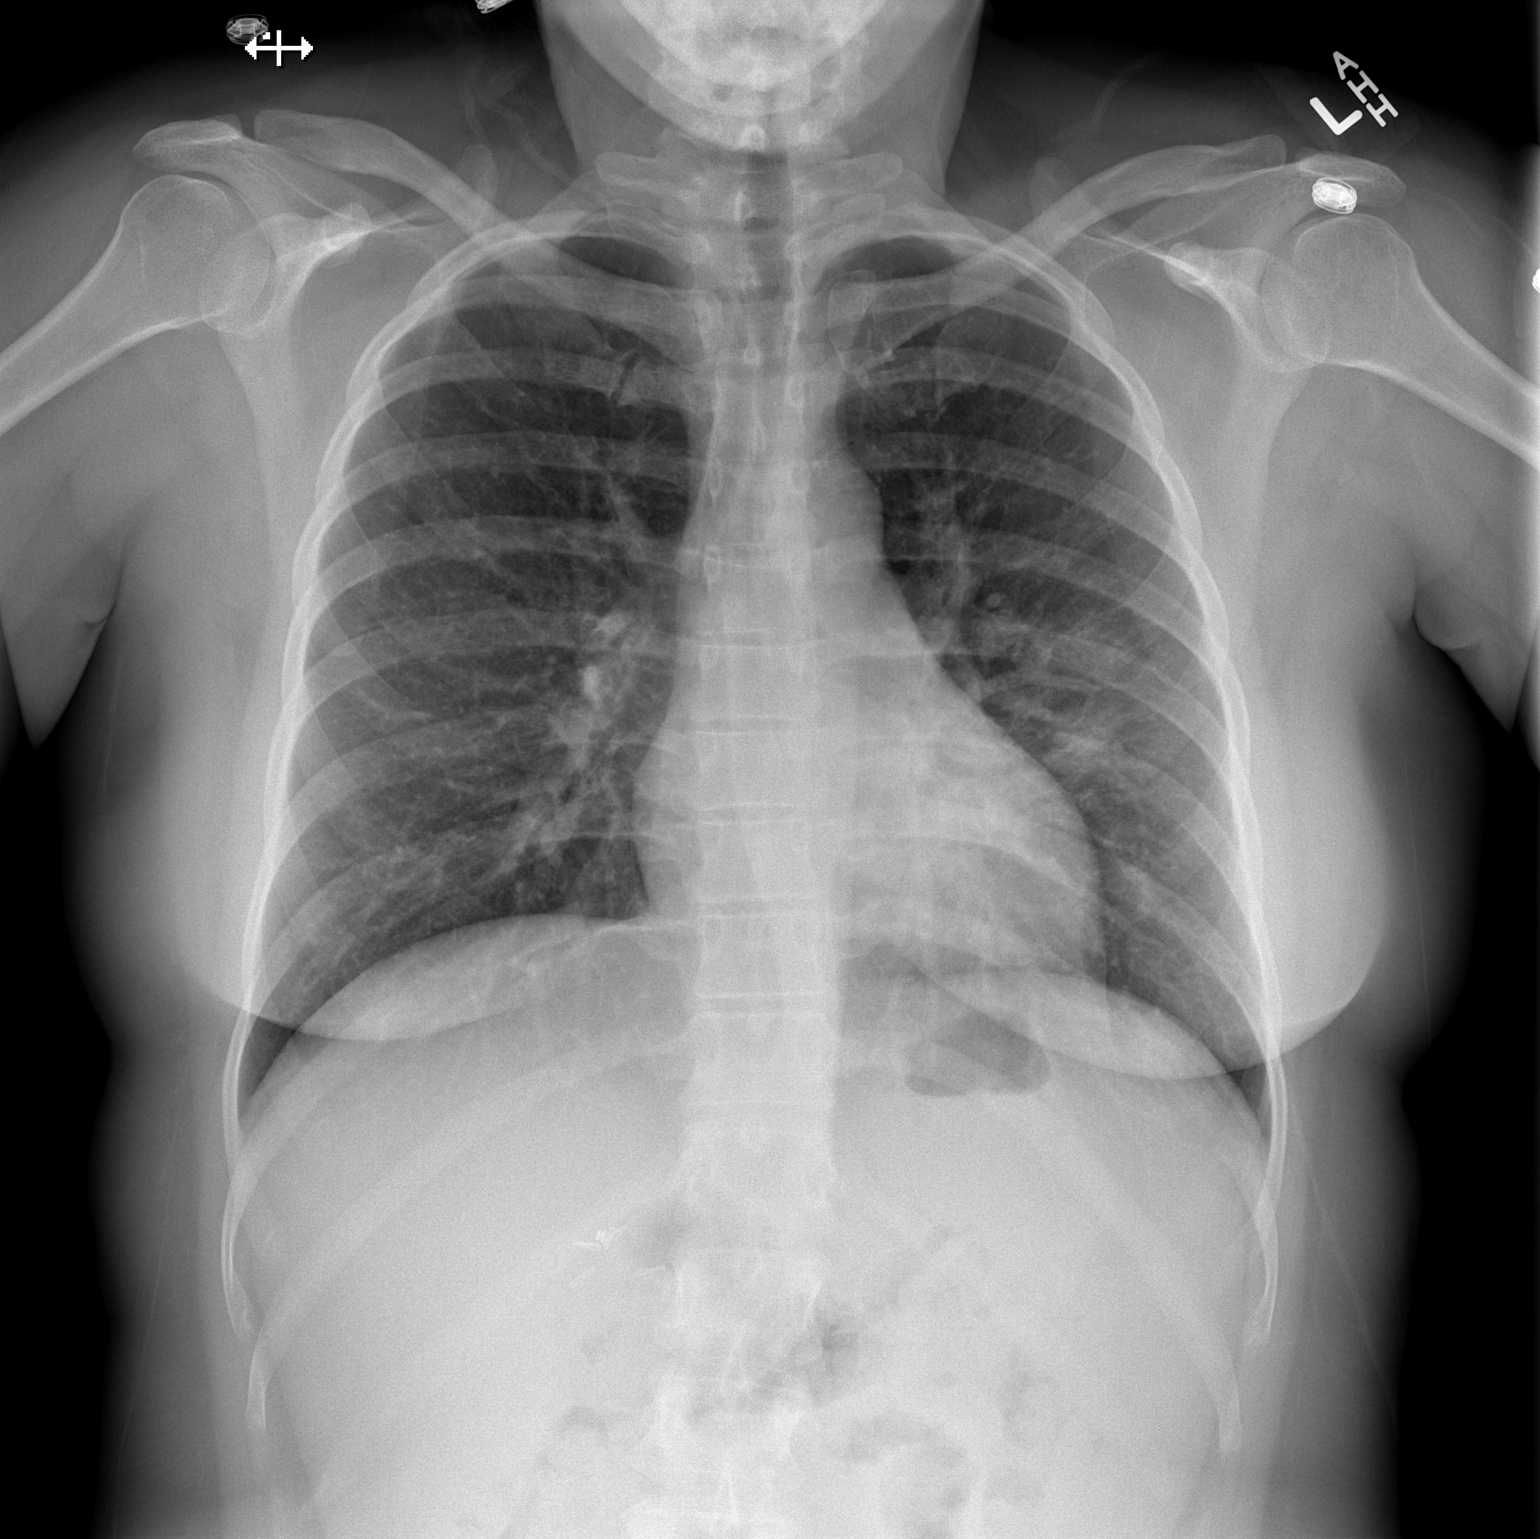

[w chest lat]
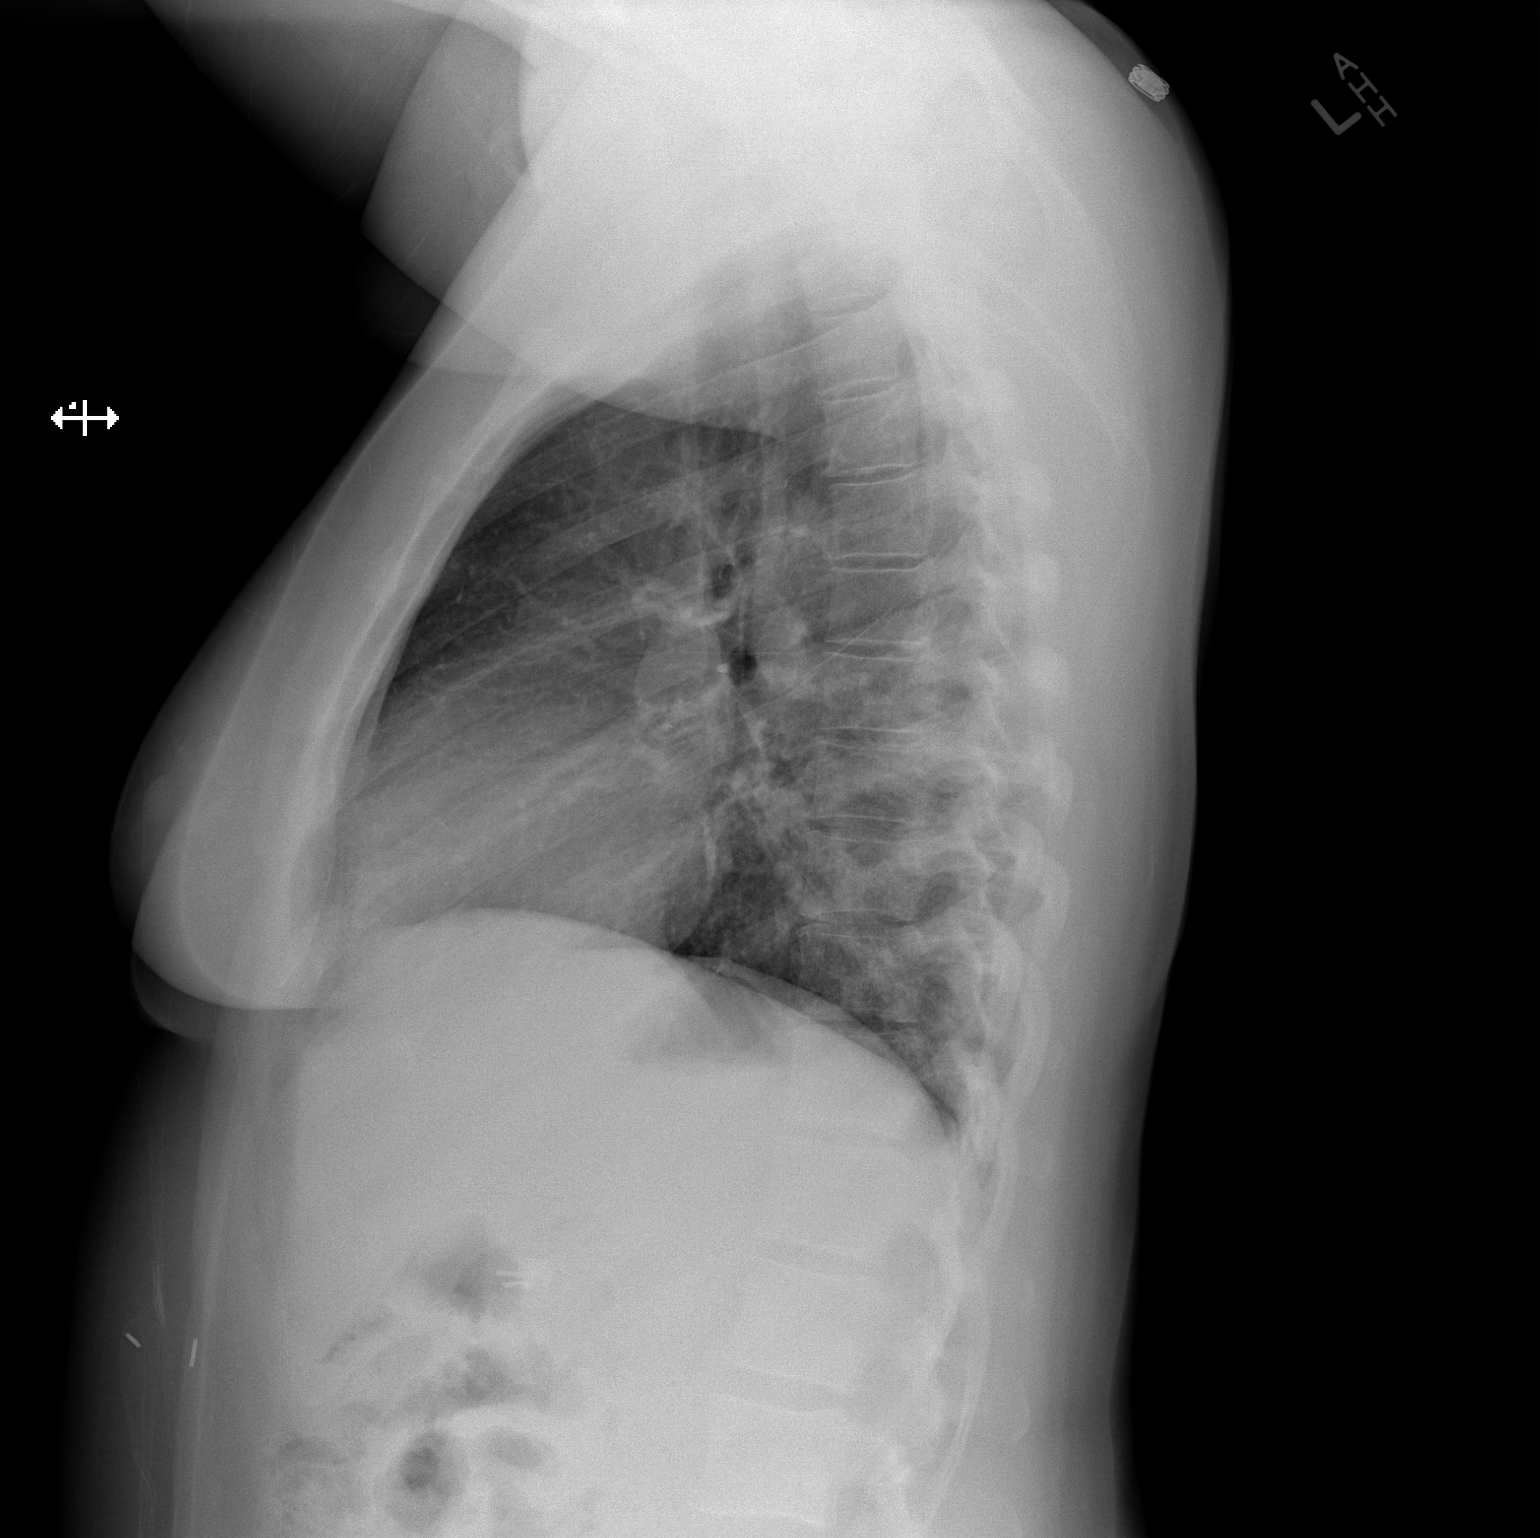

[2 of 2 positions shown; findings below may reference images not displayed]

FINDINGS: Normal heart size, mediastinal contours and pulmonary vascularity.

Minimal chronic peribronchial thickening.

Subtle patchy LEFT perihilar infiltrate likely pneumonia in LEFT
lower lobe.

Remaining lungs clear.

No pleural effusion or pneumothorax.

Surgical clips RIGHT upper quadrant question cholecystectomy.

No acute osseous findings.
IMPRESSION: Subtle LEFT lower lobe infiltrate consistent with pneumonia.

## 2014-09-10 IMAGING — CR DG CHEST 2V
2 series · 2 of 2 positions shown · non-contrast
Comparison: 12/21/2013

CLINICAL DATA: Hemoptysis

EXAM:
CHEST  2 VIEW

[w chest pa]
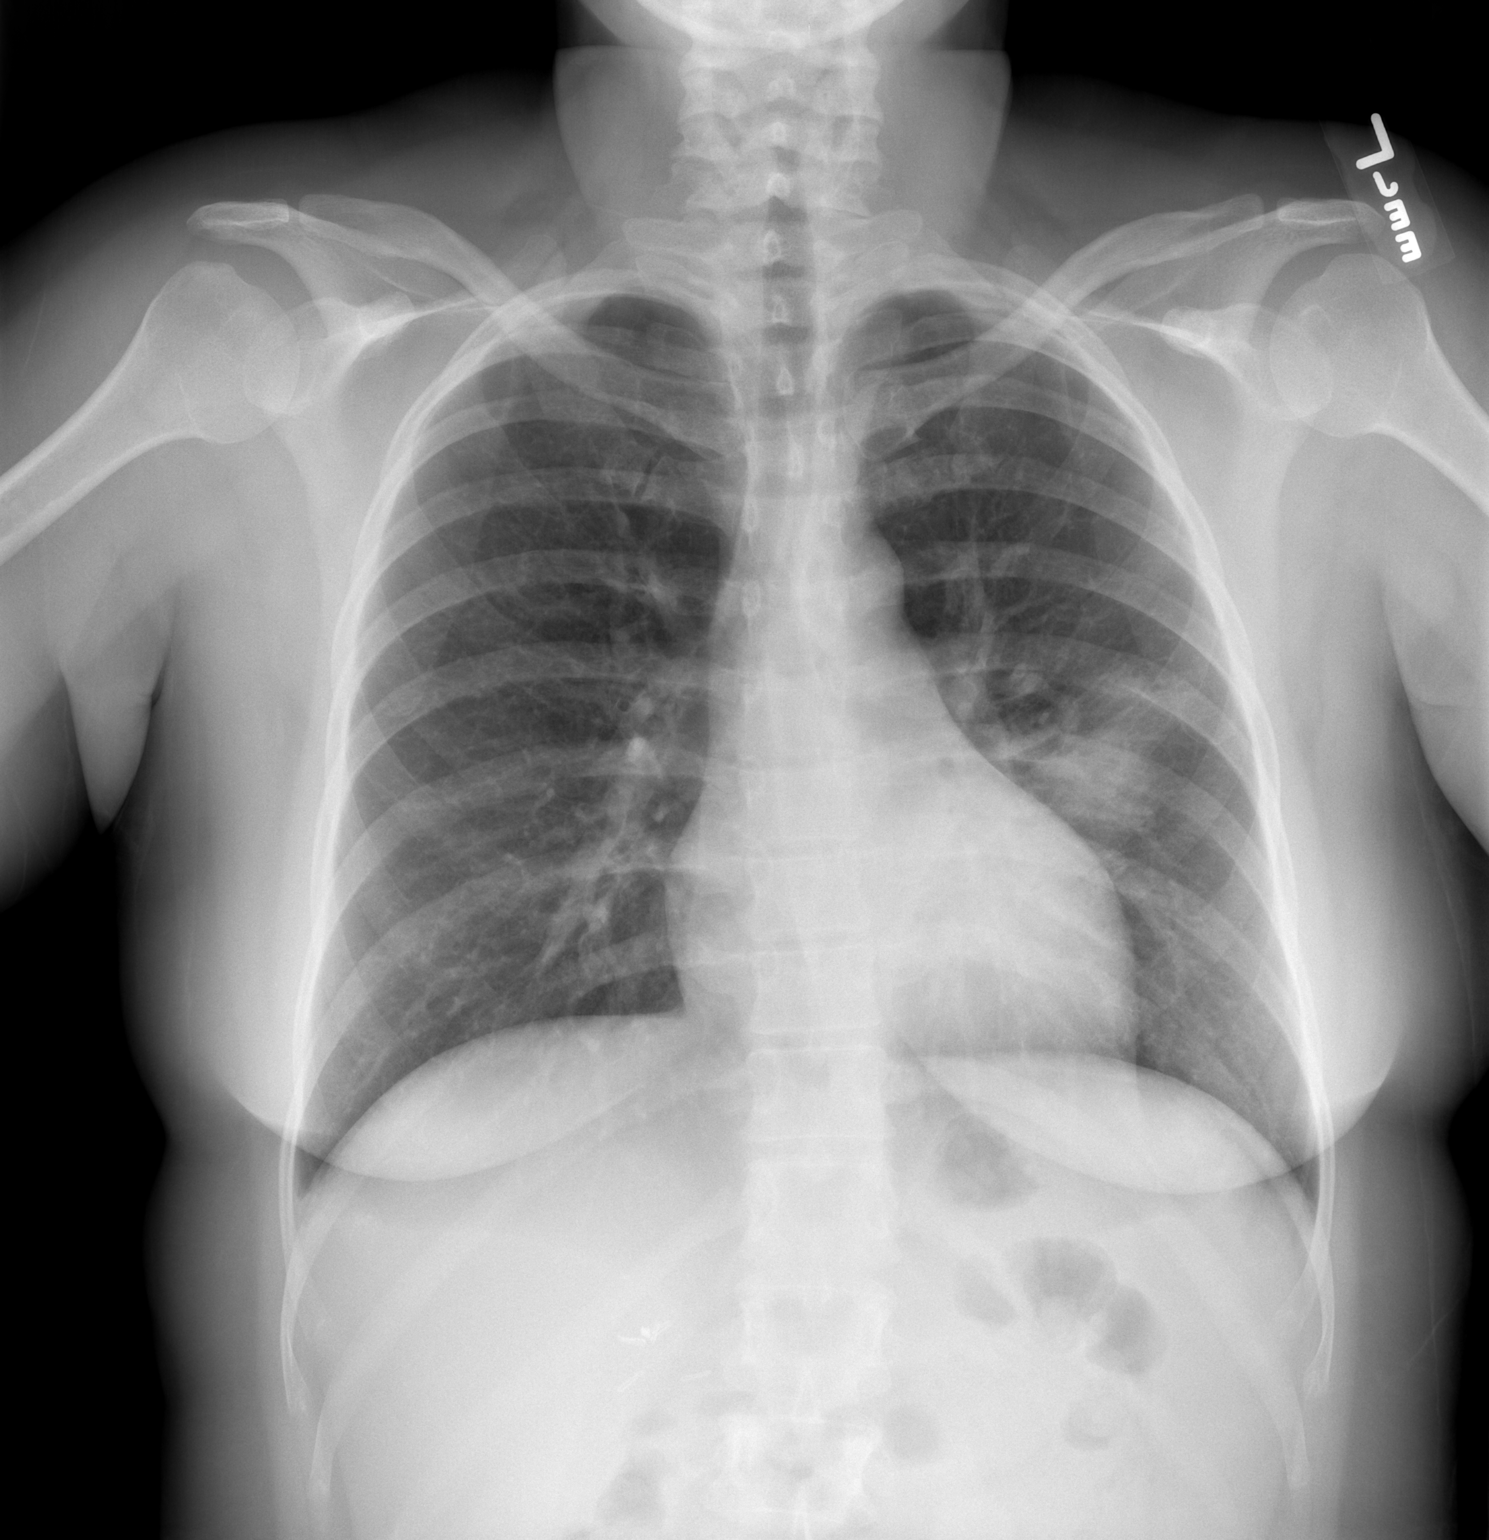

[w chest lat]
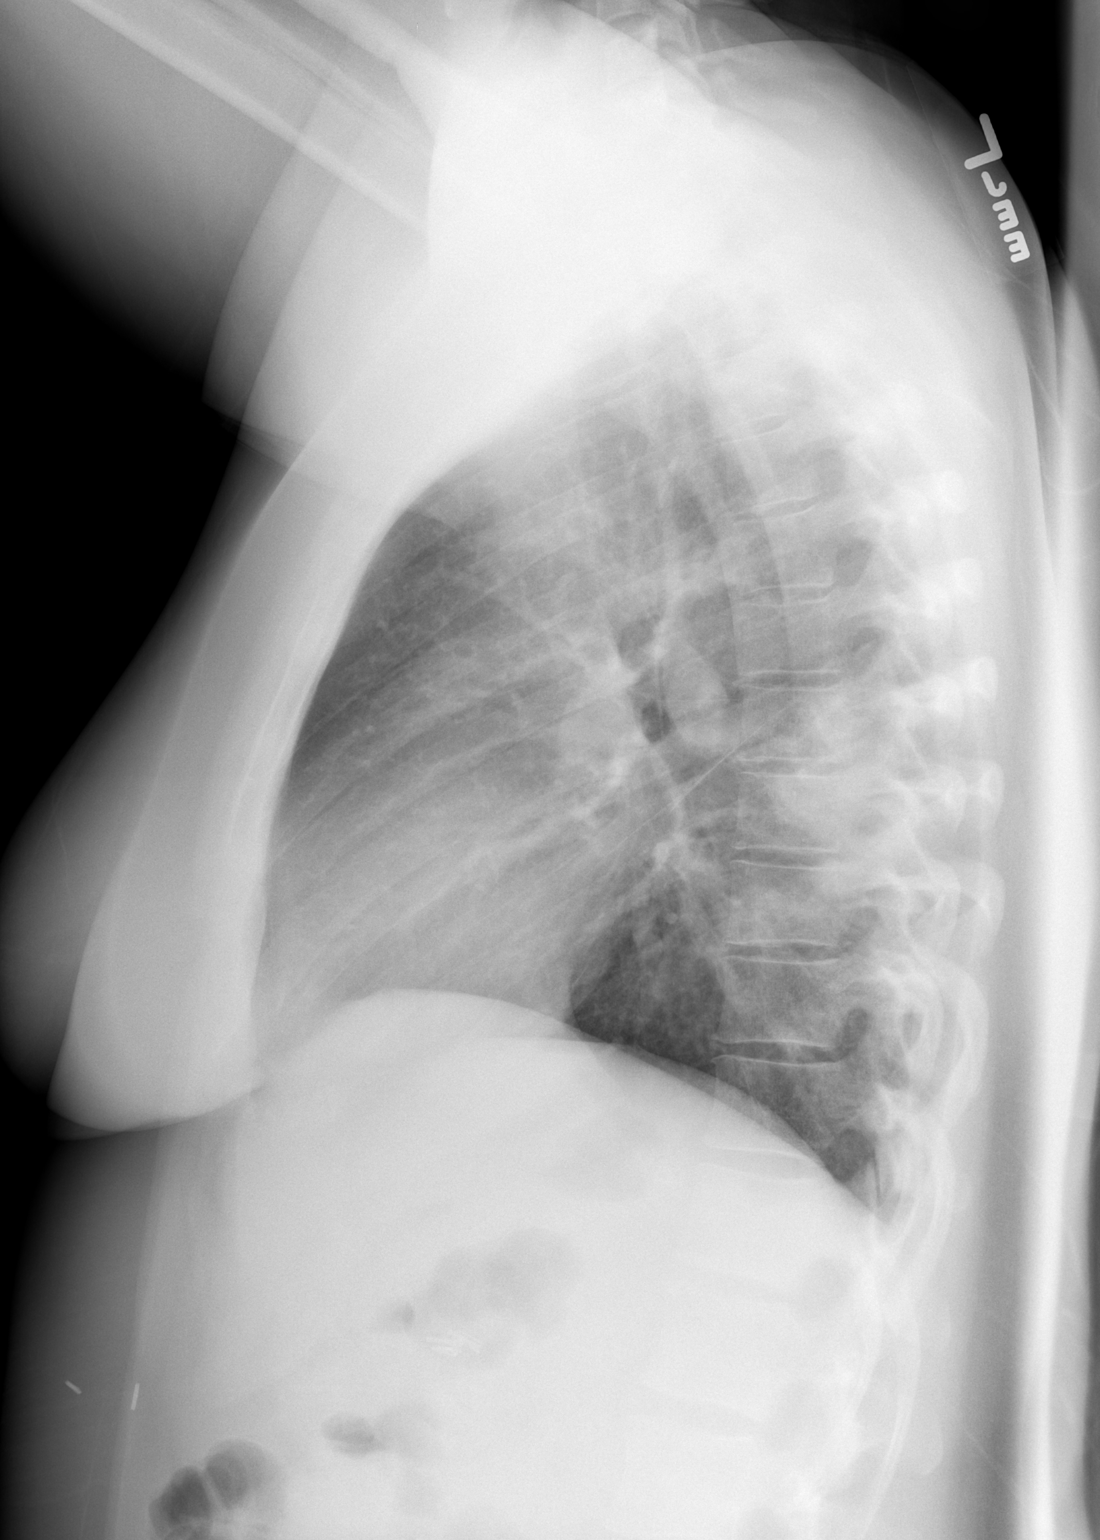

[2 of 2 positions shown; findings below may reference images not displayed]

FINDINGS: There is focal airspace disease in the superior segment of the left
lower lobe. There is no other focal parenchymal opacity. There is no
pleural effusion or pneumothorax. The heart and mediastinal contours
are unremarkable.

The osseous structures are unremarkable.
IMPRESSION: Left lower lobe pneumonia.

## 2021-02-19 ENCOUNTER — Inpatient Hospital Stay
Admit: 2021-02-19 | Discharge: 2021-02-20 | Disposition: A | Discharge status: Home / Self Care | Payer: PRIVATE HEALTH INSURANCE | Attending: Emergency Medicine

## 2021-02-19 ENCOUNTER — Emergency Department: Admit: 2021-02-19 | Payer: PRIVATE HEALTH INSURANCE

## 2021-02-19 DIAGNOSIS — S63501A Unspecified sprain of right wrist, initial encounter: Secondary | ICD-10-CM
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
## 2021-02-19 NOTE — ED Notes (Signed)
8:32 PM  02/19/21     Discharge instructions given to patient  with verbalization of understanding. Patient accompanied by self.  Patient discharged with the following prescriptions: none. Patient discharged to home.     Luz Lex, LPN
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
## 2021-04-15 ENCOUNTER — Inpatient Hospital Stay
Admit: 2021-04-15 | Discharge: 2021-04-16 | Disposition: A | Discharge status: Home / Self Care | Payer: PRIVATE HEALTH INSURANCE | Attending: Emergency Medicine

## 2021-04-15 DIAGNOSIS — R21 Rash and other nonspecific skin eruption: Secondary | ICD-10-CM

## 2021-04-15 NOTE — ED Notes (Signed)
VAT at bedside, SL inserted; blood drawn and sent to lab; requested urine from pt who states she can't pee and refuses to try

## 2021-04-15 NOTE — ED Provider Notes (Signed)
ED Provider Notes by Terald Sleeper, MD at 04/15/21 2013                Author: Terald Sleeper, MD  Service: Emergency Medicine  Author Type: Physician       Filed: 04/16/21 0335  Date of Service: 04/15/21 2013  Status: Signed          Editor: Terald Sleeper, MD (Physician)               HPI    45 year old female with history of recurrent UTIs, chronic abdominal pain, chronic nausea/vomiting and diarrhea who came to the ED due to rash.  Patient states that she was doing okay until 4 days ago when she developed bilateral flank pain like her usual  pain when she has UTI associated to unquantified fever and worsening of her chronic nausea/vomiting and diarrhea.  Today, she noticed rash on her chest associated to itchiness and decided to come to the ED for evaluation.  Patient denies trouble breathing,  trouble swallowing, chest pain, shortness of breath, sore throat, urinary changes, abnormal vaginal discharge, black or bloody stool, headache, or other complaints.       No past medical history on file.      No past surgical history on file.        No family history on file.        Social History          Socioeconomic History         ?  Marital status:  SINGLE              Spouse name:  Not on file         ?  Number of children:  Not on file     ?  Years of education:  Not on file     ?  Highest education level:  Not on file       Occupational History        ?  Not on file       Tobacco Use         ?  Smoking status:  Every Day              Types:  Cigarettes         ?  Smokeless tobacco:  Not on file       Substance and Sexual Activity         ?  Alcohol use:  Not Currently     ?  Drug use:  Not Currently     ?  Sexual activity:  Not on file        Other Topics  Concern        ?  Not on file       Social History Narrative        ?  Not on file          Social Determinants of Health          Financial Resource Strain: Not on file     Food Insecurity: Not on file      Transportation Needs: Not on file     Physical Activity: Not on file     Stress: Not on file     Social Connections: Not on file     Intimate Partner Violence: Not on file       Housing Stability: Not on file  ALLERGIES: Patient has no known allergies.      Review of Systems    Constitutional:  Positive for fever.    HENT: Negative.  Negative for trouble swallowing.     Eyes: Negative.     Respiratory: Negative.  Negative for chest tightness and shortness of breath.     Cardiovascular: Negative.  Negative for chest pain and leg swelling.    Gastrointestinal:  Positive for abdominal pain, diarrhea , nausea and vomiting. Negative  for blood in stool.    Genitourinary:  Positive for flank pain. Negative for decreased urine volume, difficulty urinating, dysuria, hematuria, vaginal bleeding and  vaginal discharge.    Musculoskeletal:  Negative for back pain.    Skin:  Positive for rash.    Neurological: Negative.     All other systems reviewed and are negative.        Vitals:            04/15/21 1851  04/15/21 1904  04/15/21 2310          BP:    123/72  (!) 113/53     Pulse:    77  70     Resp:    18  20     Temp:    98.6 ??F (37 ??C)  98.4 ??F (36.9 ??C)     SpO2:    100%  98%     Weight:  72.6 kg (160 lb)              Height:  5' 2" (1.575 m)                    Physical Exam   Vitals and nursing note reviewed. Exam conducted with a chaperone present.    Constitutional:        General: She is not in acute distress.      Appearance: Normal appearance. She is well-developed. She is obese. She is toxic-appearing . She is not ill-appearing or diaphoretic.    HENT:       Head: Normocephalic and atraumatic.       Nose: Nose normal.       Mouth/Throat:       Lips: Pink.       Mouth: Mucous membranes are dry. No angioedema.       Tongue: No lesions.       Palate: No lesions.       Pharynx: Oropharynx is clear. Uvula midline. No pharyngeal swelling, posterior oropharyngeal erythema or uvula swelling.       Tonsils: No  tonsillar exudate or tonsillar abscesses.    Eyes:       Extraocular Movements: Extraocular movements intact.       Conjunctiva/sclera: Conjunctivae normal.       Pupils: Pupils are equal, round, and reactive to light.     Cardiovascular:       Rate and Rhythm: Normal rate and regular rhythm.       Pulses: Normal pulses.            Dorsalis pedis pulses are 2+ on the right side and 2+  on the left side.       Heart sounds: Normal heart sounds.    Pulmonary:       Effort: Pulmonary effort is normal.       Breath sounds: Normal breath sounds.     Abdominal:       General: Bowel sounds are normal. There is no distension.  Palpations: Abdomen is soft.       Tenderness: There is generalized abdominal tenderness . There is right CVA tenderness and left CVA tenderness . There is no guarding or rebound. Negative signs include Murphy's sign, Rovsing's sign and McBurney's sign.     Musculoskeletal:          General: No swelling or tenderness. Normal range of motion.       Cervical back: Normal range of motion and neck supple.       Right lower leg: No edema.       Left lower leg: No edema.    Skin:      General: Skin is warm and dry.       Capillary Refill: Capillary refill takes less than 2 seconds.       Findings: Rash (Urticarial rash on chest and back) present. Rash is  urticarial.               Neurological:       General: No focal deficit present.       Mental Status: She is alert and oriented to person, place, and time.       Deep Tendon Reflexes: Reflexes are normal and symmetric.           MDM      The patient presents with back pain with a differential diagnosis of  kidney stone, lumbar strain, viral syndrome, and UTI.         Recent Results (from the past 12 hour(s))     DRUG SCREEN, URINE          Collection Time: 04/15/21  8:18 PM         Result  Value  Ref Range            Amphetamine  NEGATIVE  NEGATIVE         Barbiturates  NEGATIVE  NEGATIVE         Benzodiazepines  NEGATIVE  NEGATIVE         Cocaine   POSITIVE (A)  NEGATIVE         Marijuana  POSITIVE (A)  NEGATIVE         Methadone  NEGATIVE  NEGATIVE         Opiates  NEGATIVE  NEGATIVE         Phencyclidine  NEGATIVE  NEGATIVE         HCG URINE, QL          Collection Time: 04/15/21  8:18 PM         Result  Value  Ref Range            HCG urine, QL  NEGATIVE  NEGATIVE         CBC WITH AUTOMATED DIFF          Collection Time: 04/15/21  8:30 PM         Result  Value  Ref Range            WBC  8.5  4.0 - 11.0 1000/mm3       RBC  4.54  3.60 - 5.20 M/uL       HGB  14.1  13.0 - 17.2 gm/dl       HCT  40.6  37.0 - 50.0 %       MCV  89.4  80.0 - 98.0 fL       MCH  31.1  25.4 - 34.6 pg  MCHC  34.7  30.0 - 36.0 gm/dl       PLATELET  226  140 - 450 1000/mm3       MPV  9.7  6.0 - 10.0 fL       RDW-SD  41.0  36.4 - 46.3         NRBC  0  0 - 0         IMMATURE GRANULOCYTES  0.4  0.0 - 3.0 %       NEUTROPHILS  73.7 (H)  34 - 64 %       LYMPHOCYTES  18.6 (L)  28 - 48 %       MONOCYTES  6.0  1 - 13 %       EOSINOPHILS  0.9  0 - 5 %       BASOPHILS  0.4  0 - 3 %       METABOLIC PANEL, COMPREHENSIVE          Collection Time: 04/15/21  8:30 PM         Result  Value  Ref Range            Potassium  3.8  3.5 - 5.1 mEq/L       Chloride  107  98 - 107 mEq/L       Sodium  138  136 - 145 mEq/L       CO2  24  20 - 31 mEq/L       Glucose  86  74 - 106 mg/dl       BUN  7 (L)  9 - 23 mg/dl       Creatinine  0.65  0.55 - 1.02 mg/dl       GFR est AA  >60.0          GFR est non-AA  >60               Calcium  9.0  8.7 - 10.4 mg/dl            Anion gap  7  5 - 15 mmol/L       AST (SGOT)  27.0  0.0 - 33.9 U/L       ALT (SGPT)  26  10 - 49 U/L       Alk. phosphatase  67  46 - 116 U/L       Bilirubin, total  0.70  0.30 - 1.20 mg/dl       Protein, total  7.3  5.7 - 8.2 gm/dl       Albumin  3.8  3.4 - 5.0 gm/dl       ETHYL ALCOHOL          Collection Time: 04/15/21  8:30 PM         Result  Value  Ref Range            ALCOHOL(ETHYL),SERUM  NEGATIVE  0.0 - 3.0 mg/dl       LIPASE          Collection  Time: 04/15/21  8:30 PM         Result  Value  Ref Range            Lipase  29  12 - 53 U/L       POC URINE MACROSCOPIC          Collection Time: 04/15/21 10:20 PM         Result  Value  Ref Range  Glucose  500 (A)  NEGATIVE,Negative mg/dl       Bilirubin  Negative  NEGATIVE,Negative         Ketone  Negative  NEGATIVE,Negative mg/dl       Specific gravity  1.015  1.005 - 1.030         Blood  Negative  NEGATIVE,Negative         pH (UA)  6.0  5 - 9         Protein  Negative  NEGATIVE,Negative mg/dl       Urobilinogen  0.2  0.0 - 1.0 EU/dl       Nitrites  Negative  NEGATIVE,Negative         Leukocyte Esterase  Negative  NEGATIVE,Negative         Color  Yellow          Appearance  Clear          GLUCOSE, POC          Collection Time: 04/15/21 10:28 PM         Result  Value  Ref Range            Glucose (POC)  100  65 - 105 mg/dL          11:38 PM ED work-up is unremarkable.  Patient was informed of the results and upon reevaluation she is complaining of more back/flank pain despite treatment.  Rash improved significantly.  I will give her a dose of pain medication and add an abdominal  pelvic CT to rule out kidney stone.      12:56 AM Abdominal pelvic CT is still pending.  I will place her in our ED observation awaiting CT results.       ProceduresNA                  ICD-10-CM  ICD-9-CM          1.  Rash   R21  782.1     2.  Flank pain, chronic   R10.9  789.09           G89.29  338.29

## 2021-04-15 NOTE — ED Notes (Signed)
Remains quiet, resting comfortably; dispo pending

## 2021-04-15 NOTE — ED Notes (Signed)
C/o swelling and redness with itchiness on neck spreads to face since 1600. Denies shortness of breath or problem on swallowing.     Stated  she's been laying on bed for few days due to B flank pain. Denies taking new medicines

## 2021-04-15 NOTE — ED Notes (Signed)
Assumed care of pt who is ao x 4  Flushed all over  + etoh on breath  NAD  Attempted iv access x 1, pt reports she is a difficult stick   VAT order placed

## 2021-04-15 NOTE — ED Notes (Signed)
Pt sleeping s/p IVF and med admin

## 2021-04-16 LAB — CBC WITH AUTO DIFFERENTIAL
Basophils %: 0.4 % (ref 0–3)
Eosinophils %: 0.9 % (ref 0–5)
Hematocrit: 40.6 % (ref 37.0–50.0)
Hemoglobin: 14.1 gm/dl (ref 13.0–17.2)
Immature Granulocytes: 0.4 % (ref 0.0–3.0)
Lymphocytes %: 18.6 % — ABNORMAL LOW (ref 28–48)
MCH: 31.1 pg (ref 25.4–34.6)
MCHC: 34.7 gm/dl (ref 30.0–36.0)
MCV: 89.4 fL (ref 80.0–98.0)
MPV: 9.7 fL (ref 6.0–10.0)
Monocytes %: 6 % (ref 1–13)
Neutrophils %: 73.7 % — ABNORMAL HIGH (ref 34–64)
Nucleated RBCs: 0 (ref 0–0)
Platelets: 226 10*3/uL (ref 140–450)
RBC: 4.54 M/uL (ref 3.60–5.20)
RDW-SD: 41 (ref 36.4–46.3)
WBC: 8.5 10*3/uL (ref 4.0–11.0)

## 2021-04-16 LAB — POC URINE MACROSCOPIC
Bilirubin, Urine: NEGATIVE
Bilirubin: NEGATIVE
Blood, Urine: NEGATIVE
Blood: NEGATIVE
Glucose, Ur: 500 mg/dl — AB
Glucose: 500 mg/dl — AB
Ketone: NEGATIVE mg/dl
Ketones, Urine: NEGATIVE mg/dl
Leukocyte Esterase, Urine: NEGATIVE
Leukocyte Esterase: NEGATIVE
Nitrite, Urine: NEGATIVE
Nitrites: NEGATIVE
Protein, UA: NEGATIVE mg/dl
Protein: NEGATIVE mg/dl
Specific Gravity, UA: 1.015 (ref 1.005–1.030)
Specific gravity: 1.015 (ref 1.005–1.030)
Urobilinogen, UA, POCT: 0.2 EU/dl (ref 0.0–1.0)
Urobilinogen: 0.2 EU/dl (ref 0.0–1.0)
pH (UA): 6 (ref 5–9)
pH, UA: 6 (ref 5–9)

## 2021-04-16 LAB — COMPREHENSIVE METABOLIC PANEL
ALT: 26 U/L (ref 10–49)
AST: 27 U/L (ref 0.0–33.9)
Albumin: 3.8 gm/dl (ref 3.4–5.0)
Alkaline Phosphatase: 67 U/L (ref 46–116)
Anion Gap: 7 mmol/L (ref 5–15)
BUN: 7 mg/dl — ABNORMAL LOW (ref 9–23)
CO2: 24 mEq/L (ref 20–31)
Calcium: 9 mg/dl (ref 8.7–10.4)
Chloride: 107 mEq/L (ref 98–107)
Creatinine: 0.65 mg/dl (ref 0.55–1.02)
EGFR IF NonAfrican American: >60
GFR African American: >60
Glucose: 86 mg/dl (ref 74–106)
Potassium: 3.8 mEq/L (ref 3.5–5.1)
Sodium: 138 mEq/L (ref 136–145)
Total Bilirubin: 0.7 mg/dl (ref 0.30–1.20)
Total Protein: 7.3 gm/dl (ref 5.7–8.2)

## 2021-04-16 LAB — DRUG SCREEN, URINE
Amphetamine: NEGATIVE
Amphetamines: NEGATIVE
Barbiturates: NEGATIVE
Barbiturates: NEGATIVE
Benzodiazapines: NEGATIVE
Benzodiazepines: NEGATIVE
Cocaine: POSITIVE — AB
Cocaine: POSITIVE — AB
Marijuana: POSITIVE — AB
Marijuana: POSITIVE — AB
Methadone: NEGATIVE
Methadone: NEGATIVE
Opiates: NEGATIVE
Opiates: NEGATIVE
Phencyclidine: NEGATIVE
Phencyclidine: NEGATIVE

## 2021-04-16 LAB — ETHYL ALCOHOL
ALCOHOL(ETHYL),SERUM: NEGATIVE mg/dl (ref 0.0–3.0)
Ethyl Alcohol: NEGATIVE mg/dl (ref 0.0–3.0)

## 2021-04-16 LAB — HCG URINE, QL
HCG urine, QL: NEGATIVE
Pregnancy Test(Urn): NEGATIVE

## 2021-04-16 LAB — POCT GLUCOSE: POC Glucose: 100 mg/dL (ref 65–105)

## 2021-04-16 LAB — LIPASE
Lipase: 29 U/L (ref 12–53)
Lipase: 29 U/L (ref 12–53)

## 2021-04-16 LAB — CBC WITH AUTOMATED DIFF
BASOPHILS: 0.4 % (ref 0–3)
EOSINOPHILS: 0.9 % (ref 0–5)
HCT: 40.6 % (ref 37.0–50.0)
HGB: 14.1 gm/dl (ref 13.0–17.2)
IMMATURE GRANULOCYTES: 0.4 % (ref 0.0–3.0)
LYMPHOCYTES: 18.6 % — ABNORMAL LOW (ref 28–48)
MCH: 31.1 pg (ref 25.4–34.6)
MCHC: 34.7 gm/dl (ref 30.0–36.0)
MCV: 89.4 fL (ref 80.0–98.0)
MONOCYTES: 6 % (ref 1–13)
MPV: 9.7 fL (ref 6.0–10.0)
NEUTROPHILS: 73.7 % — ABNORMAL HIGH (ref 34–64)
NRBC: 0 (ref 0–0)
PLATELET: 226 10*3/uL (ref 140–450)
RBC: 4.54 M/uL (ref 3.60–5.20)
RDW-SD: 41 (ref 36.4–46.3)
WBC: 8.5 10*3/uL (ref 4.0–11.0)

## 2021-04-16 LAB — METABOLIC PANEL, COMPREHENSIVE
ALT (SGPT): 26 U/L (ref 10–49)
AST (SGOT): 27 U/L (ref 0.0–33.9)
Albumin: 3.8 gm/dl (ref 3.4–5.0)
Alk. phosphatase: 67 U/L (ref 46–116)
Anion gap: 7 mmol/L (ref 5–15)
BUN: 7 mg/dl — ABNORMAL LOW (ref 9–23)
Bilirubin, total: 0.7 mg/dl (ref 0.30–1.20)
CO2: 24 mEq/L (ref 20–31)
Calcium: 9 mg/dl (ref 8.7–10.4)
Chloride: 107 mEq/L (ref 98–107)
Creatinine: 0.65 mg/dl (ref 0.55–1.02)
GFR est AA: >60
GFR est non-AA: >60
Glucose: 86 mg/dl (ref 74–106)
Potassium: 3.8 mEq/L (ref 3.5–5.1)
Protein, total: 7.3 gm/dl (ref 5.7–8.2)
Sodium: 138 mEq/L (ref 136–145)

## 2021-04-16 LAB — GLUCOSE, POC: Glucose (POC): 100 mg/dL (ref 65–105)

## 2021-04-16 MED ORDER — DIPHENHYDRAMINE HCL 50 MG/ML IJ SOLN
50 mg/mL | INTRAMUSCULAR | Status: AC
Start: 2021-04-16 — End: 2021-04-15
  Administered 2021-04-16: 01:00:00 via INTRAVENOUS

## 2021-04-16 MED ORDER — FAMOTIDINE (PF) 20 MG/2 ML IV
20 mg/2 mL | INTRAVENOUS | Status: AC
Start: 2021-04-16 — End: 2021-04-15
  Administered 2021-04-16: 01:00:00 via INTRAVENOUS

## 2021-04-16 MED ORDER — DIPHENHYDRAMINE 25 MG TAB
25 mg | ORAL_TABLET | Freq: Four times a day (QID) | ORAL | 0 refills | Status: AC | PRN
Start: 2021-04-16 — End: 2021-04-21

## 2021-04-16 MED ORDER — SODIUM CHLORIDE 0.9% BOLUS IV
0.9 % | INTRAVENOUS | Status: AC
Start: 2021-04-16 — End: 2021-04-15
  Administered 2021-04-16: 01:00:00 via INTRAVENOUS

## 2021-04-16 MED ORDER — ACETAMINOPHEN 1,000 MG/100 ML (10 MG/ML) IV
1000 mg/100 mL (10 mg/mL) | Freq: Once | INTRAVENOUS | Status: AC
Start: 2021-04-16 — End: 2021-04-16
  Administered 2021-04-16: 04:00:00 via INTRAVENOUS

## 2021-04-16 MED ORDER — PREDNISONE 20 MG TAB
20 mg | ORAL_TABLET | Freq: Every day | ORAL | 0 refills | Status: AC
Start: 2021-04-16 — End: 2021-04-21

## 2021-04-16 MED ORDER — FAMOTIDINE 20 MG TAB
20 mg | ORAL_TABLET | Freq: Two times a day (BID) | ORAL | 0 refills | Status: AC
Start: 2021-04-16 — End: 2021-04-21

## 2021-04-16 MED ORDER — METHYLPREDNISOLONE (PF) 125 MG/2 ML IJ SOLR
125 mg/2 mL | INTRAMUSCULAR | Status: AC
Start: 2021-04-16 — End: 2021-04-15
  Administered 2021-04-16: 01:00:00 via INTRAVENOUS

## 2021-04-16 MED ORDER — SODIUM CHLORIDE 0.9 % IJ SYRG
Freq: Once | INTRAMUSCULAR | Status: AC
Start: 2021-04-16 — End: 2021-04-15
  Administered 2021-04-16: 04:00:00 via INTRAVENOUS

## 2021-04-16 MED FILL — SODIUM CHLORIDE 0.9 % IV: INTRAVENOUS | Qty: 1000

## 2021-04-16 MED FILL — OFIRMEV 1,000 MG/100 ML (10 MG/ML) INTRAVENOUS SOLUTION: 1000 mg/100 mL (10 mg/mL) | INTRAVENOUS | Qty: 100

## 2021-04-16 MED FILL — DIPHENHYDRAMINE HCL 50 MG/ML IJ SOLN: 50 mg/mL | INTRAMUSCULAR | Qty: 1

## 2021-04-16 MED FILL — FAMOTIDINE (PF) 20 MG/2 ML IV: 20 mg/2 mL | INTRAVENOUS | Qty: 2

## 2021-04-16 MED FILL — SOLU-MEDROL (PF) 125 MG/2 ML SOLUTION FOR INJECTION: 125 mg/2 mL | INTRAMUSCULAR | Qty: 2

## 2021-04-16 NOTE — ED Notes (Signed)
CT scan WDL  Dc and fu instructions given

## 2021-04-16 NOTE — ED Notes (Signed)
2nd call to lab to request they run the urine for UPT; advised pt in holding pattern for CT scan bc we need results; stated "it takes 5 minutes, I will do it now"

## 2021-04-16 NOTE — ED Provider Notes (Signed)
[place]  Emergency ObservationDepartment   Discharge Summary    Patient: Erin Mckinney Age: 45 y.o. Sex: female    Date of Birth: Oct 19, 1975 Admit Date: 04/15/2021 PCP: None   MRN: [id number]  CSN: [id number]     Room: 112/EO12       ED Physician   Dr. Barbee Shropshire Rodr??guez  Discharge Physician   Dr. Terald Sleeper  Observation Admission   12:56 AM  Observation Discharge  2:38 AM    History of Present Illness   45 y.o. female with history of recurrent UTIs, chronic abdominal pain, chronic nausea/vomiting and diarrhea who came to the ED due to rash.  Patient states that she was doing okay until 4 days ago when she developed bilateral flank pain like her usual pain when she has UTI associated to unquantified fever and worsening of her chronic nausea/vomiting and diarrhea.  Today, she noticed rash on her chest associated to itchiness and decided to come to the ED for evaluation.  Patient denies trouble breathing, trouble swallowing, chest pain, shortness of breath, sore throat, urinary changes, abnormal vaginal discharge, black or bloody stool, headache, or other complaints.    ED Observation Course     Recent Results (from the past 12 hour(s))   DRUG SCREEN, URINE    Collection Time: 04/15/21  8:18 PM   Result Value Ref Range    Amphetamine NEGATIVE NEGATIVE      Barbiturates NEGATIVE NEGATIVE      Benzodiazepines NEGATIVE NEGATIVE      Cocaine POSITIVE (A) NEGATIVE      Marijuana POSITIVE (A) NEGATIVE      Methadone NEGATIVE NEGATIVE      Opiates NEGATIVE NEGATIVE      Phencyclidine NEGATIVE NEGATIVE     HCG URINE, QL    Collection Time: 04/15/21  8:18 PM   Result Value Ref Range    HCG urine, QL NEGATIVE NEGATIVE     CBC WITH AUTOMATED DIFF    Collection Time: 04/15/21  8:30 PM   Result Value Ref Range    WBC 8.5 4.0 - 11.0 1000/mm3    RBC 4.54 3.60 - 5.20 M/uL    HGB 14.1 13.0 - 17.2 gm/dl    HCT 40.6 37.0 - 50.0 %    MCV 89.4 80.0 - 98.0 fL    MCH 31.1 25.4 - 34.6 pg    MCHC 34.7  30.0 - 36.0 gm/dl    PLATELET 226 140 - 450 1000/mm3    MPV 9.7 6.0 - 10.0 fL    RDW-SD 41.0 36.4 - 46.3      NRBC 0 0 - 0      IMMATURE GRANULOCYTES 0.4 0.0 - 3.0 %    NEUTROPHILS 73.7 (H) 34 - 64 %    LYMPHOCYTES 18.6 (L) 28 - 48 %    MONOCYTES 6.0 1 - 13 %    EOSINOPHILS 0.9 0 - 5 %    BASOPHILS 0.4 0 - 3 %   METABOLIC PANEL, COMPREHENSIVE    Collection Time: 04/15/21  8:30 PM   Result Value Ref Range    Potassium 3.8 3.5 - 5.1 mEq/L    Chloride 107 98 - 107 mEq/L    Sodium 138 136 - 145 mEq/L    CO2 24 20 - 31 mEq/L    Glucose 86 74 - 106 mg/dl    BUN 7 (L) 9 - 23 mg/dl    Creatinine 0.65 0.55 - 1.02 mg/dl    GFR  est AA >60.0      GFR est non-AA >60      Calcium 9.0 8.7 - 10.4 mg/dl    Anion gap 7 5 - 15 mmol/L    AST (SGOT) 27.0 0.0 - 33.9 U/L    ALT (SGPT) 26 10 - 49 U/L    Alk. phosphatase 67 46 - 116 U/L    Bilirubin, total 0.70 0.30 - 1.20 mg/dl    Protein, total 7.3 5.7 - 8.2 gm/dl    Albumin 3.8 3.4 - 5.0 gm/dl   ETHYL ALCOHOL    Collection Time: 04/15/21  8:30 PM   Result Value Ref Range    ALCOHOL(ETHYL),SERUM NEGATIVE 0.0 - 3.0 mg/dl   LIPASE    Collection Time: 04/15/21  8:30 PM   Result Value Ref Range    Lipase 29 12 - 53 U/L   POC URINE MACROSCOPIC    Collection Time: 04/15/21 10:20 PM   Result Value Ref Range    Glucose 500 (A) NEGATIVE,Negative mg/dl    Bilirubin Negative NEGATIVE,Negative      Ketone Negative NEGATIVE,Negative mg/dl    Specific gravity 1.015 1.005 - 1.030      Blood Negative NEGATIVE,Negative      pH (UA) 6.0 5 - 9      Protein Negative NEGATIVE,Negative mg/dl    Urobilinogen 0.2 0.0 - 1.0 EU/dl    Nitrites Negative NEGATIVE,Negative      Leukocyte Esterase Negative NEGATIVE,Negative      Color Yellow      Appearance Clear     GLUCOSE, POC    Collection Time: 04/15/21 10:28 PM   Result Value Ref Range    Glucose (POC) 100 65 - 105 mg/dL      CT ABD PELV WO CONT    Result Date: 04/16/2021  IMPRESSION: 1. No nephrolithiasis or hydronephrosis. 2. Diverticulosis. 3. Hysterectomy.  Electronically signed by: Kathlene Cote, MD 04/16/2021 2:28 AM EDT       2:38 AM Urine drug screen positive for cocaine and marijuana.  Abdominopelvic CT shows no nephrolithiasis or hydronephrosis.  Diverticulosis.  Patient was informed of the results and reported feeling much better after treatment.  No flank pain.  Rash improved. Patient is comfortable in no distress and nontoxic.  No evidence of UTI, kidney stone, pancreatitis, anaphylactic reaction.  No suspicion of sepsis, aortic dissection.  Patient will be discharged home with instructions to follow with the primary doctor and to return to the ED if no improvement or worsening of the condition.  Patient understood and agreed     Physical Exam   Visit Vitals  BP (!) 113/53 (BP 1 Location: Right upper arm, BP Patient Position: Lying right side)   Pulse 70   Temp 98.4 ??F (36.9 ??C)   Resp 20   Ht 5' 2" (1.575 m)   Wt 72.6 kg (160 lb)   SpO2 98%   BMI 29.26 kg/m??      Physical Exam  Vitals and nursing note reviewed. Exam conducted with a chaperone present.   Constitutional:       General: She is not in acute distress.     Appearance: Normal appearance. She is well-developed. She is obese. She is not ill-appearing or diaphoretic.   HENT:      Head: Normocephalic and atraumatic.      Nose: Nose normal.      Mouth/Throat:      Lips: Pink.      Mouth: Mucous membranes are moist.  No  angioedema.      Tongue: No lesions.      Palate: No lesions.      Pharynx: Oropharynx is clear. Uvula midline. No pharyngeal swelling, posterior oropharyngeal erythema or uvula swelling.      Tonsils: No tonsillar exudate or tonsillar abscesses.   Eyes:      Extraocular Movements: Extraocular movements intact.      Conjunctiva/sclera: Conjunctivae normal.      Pupils: Pupils are equal, round, and reactive to light.   Cardiovascular:      Rate and Rhythm: Normal rate and regular rhythm.      Pulses: Normal pulses.           Dorsalis pedis pulses are 2+ on the right side and 2+ on the left  side.      Heart sounds: Normal heart sounds.   Pulmonary:      Effort: Pulmonary effort is normal.      Breath sounds: Normal breath sounds.   Abdominal:      General: Bowel sounds are normal. There is no distension.      Palpations: Abdomen is soft.      Tenderness: No tenderness.  No bilateral CVA tenderness.  There is no guarding or rebound. Negative signs include Murphy's sign, Rovsing's sign and McBurney's sign.   Musculoskeletal:         General: No swelling or tenderness. Normal range of motion.      Cervical back: Normal range of motion and neck supple.      Right lower leg: No edema.      Left lower leg: No edema.   Skin:     General: Skin is warm and dry.      Capillary Refill: Capillary refill takes less than 2 seconds.      Findings: No rash.  Neurological:      General: No focal deficit present.      Mental Status: She is alert and oriented to person, place, and time.      Deep Tendon Reflexes: Reflexes are normal and symmetric.   Clinical Impression/diagnosis       ICD-10-CM ICD-9-CM   1. Rash  R21 782.1   2. Flank pain, chronic  R10.9 789.09    G89.29 338.29         Disposition and Plan   Patient is discharged home in stable condition. Advised to follow up with PCP.  Patient advised to return for any new or worsening symptoms.      Discharge Medication List as of 04/16/2021  2:38 AM        START taking these medications    Details   diphenhydrAMINE (Benadryl Allergy) 25 mg tablet Take 1 Tablet by mouth every six (6) hours as needed for Itching or Allergies for up to 5 days., Print, Disp-20 Tablet, R-0      famotidine (Pepcid) 20 mg tablet Take 1 Tablet by mouth two (2) times a day for 5 days., Print, Disp-10 Tablet, R-0      predniSONE (DELTASONE) 20 mg tablet Take 1 Tablet by mouth daily for 5 days. With Breakfast, Print, Disp-5 Tablet, R-0             Dragon medical dictation software was used for portions of this report. Unintended errors may occur.    Terald Sleeper, MD  April 16, 2021

## 2021-11-09 ENCOUNTER — Ambulatory Visit

## 2021-11-09 ENCOUNTER — Inpatient Hospital Stay: Payer: PRIVATE HEALTH INSURANCE

## 2021-11-09 ENCOUNTER — Encounter

## 2021-11-09 DIAGNOSIS — F410 Panic disorder [episodic paroxysmal anxiety] without agoraphobia: Secondary | ICD-10-CM
